# Patient Record
Sex: Female | Born: 1980 | Race: White | Hispanic: No | Marital: Married | Smoking: Never smoker
Health system: Southern US, Community
[De-identification: ages and names within clinical notes are randomized; demographics above are authoritative.]

## PROBLEM LIST (undated history)

## (undated) DIAGNOSIS — O09299 Supervision of pregnancy with other poor reproductive or obstetric history, unspecified trimester: Secondary | ICD-10-CM

## (undated) DIAGNOSIS — R87629 Unspecified abnormal cytological findings in specimens from vagina: Secondary | ICD-10-CM

## (undated) DIAGNOSIS — E119 Type 2 diabetes mellitus without complications: Secondary | ICD-10-CM

## (undated) DIAGNOSIS — Z8759 Personal history of other complications of pregnancy, childbirth and the puerperium: Secondary | ICD-10-CM

## (undated) HISTORY — DX: Type 2 diabetes mellitus without complications: E11.9

## (undated) HISTORY — DX: Unspecified abnormal cytological findings in specimens from vagina: R87.629

## (undated) HISTORY — PX: COLPOSCOPY: SHX161

---

## 2013-10-27 ENCOUNTER — Emergency Department (HOSPITAL_COMMUNITY)
Admission: EM | Admit: 2013-10-27 | Discharge: 2013-10-28 | Disposition: A | Discharge status: Home / Self Care | Payer: 59 | Attending: Emergency Medicine | Admitting: Emergency Medicine

## 2013-10-27 ENCOUNTER — Encounter (HOSPITAL_COMMUNITY): Payer: Self-pay | Admitting: Emergency Medicine

## 2013-10-27 DIAGNOSIS — R1013 Epigastric pain: Secondary | ICD-10-CM | POA: Insufficient documentation

## 2013-10-27 DIAGNOSIS — R509 Fever, unspecified: Secondary | ICD-10-CM | POA: Insufficient documentation

## 2013-10-27 DIAGNOSIS — D709 Neutropenia, unspecified: Secondary | ICD-10-CM | POA: Diagnosis present

## 2013-10-27 DIAGNOSIS — R5081 Fever presenting with conditions classified elsewhere: Secondary | ICD-10-CM

## 2013-10-27 DIAGNOSIS — Z3202 Encounter for pregnancy test, result negative: Secondary | ICD-10-CM | POA: Insufficient documentation

## 2013-10-27 DIAGNOSIS — Z79899 Other long term (current) drug therapy: Secondary | ICD-10-CM | POA: Insufficient documentation

## 2013-10-27 DIAGNOSIS — R110 Nausea: Secondary | ICD-10-CM | POA: Insufficient documentation

## 2013-10-27 DIAGNOSIS — H547 Unspecified visual loss: Secondary | ICD-10-CM | POA: Insufficient documentation

## 2013-10-27 DIAGNOSIS — R1031 Right lower quadrant pain: Secondary | ICD-10-CM | POA: Insufficient documentation

## 2013-10-27 LAB — CBC WITH DIFFERENTIAL/PLATELET
BASOS ABS: 0 10*3/uL (ref 0.0–0.1)
Basophils Relative: 0 % (ref 0–1)
EOS ABS: 0.4 10*3/uL (ref 0.0–0.7)
EOS PCT: 3 % (ref 0–5)
HCT: 43.2 % (ref 36.0–46.0)
Hemoglobin: 14.8 g/dL (ref 12.0–15.0)
LYMPHS PCT: 24 % (ref 12–46)
Lymphs Abs: 3 10*3/uL (ref 0.7–4.0)
MCH: 31.8 pg (ref 26.0–34.0)
MCHC: 34.3 g/dL (ref 30.0–36.0)
MCV: 92.7 fL (ref 78.0–100.0)
Monocytes Absolute: 0.8 10*3/uL (ref 0.1–1.0)
Monocytes Relative: 6 % (ref 3–12)
Neutro Abs: 8.6 10*3/uL — ABNORMAL HIGH (ref 1.7–7.7)
Neutrophils Relative %: 67 % (ref 43–77)
Platelets: 360 10*3/uL (ref 150–400)
RBC: 4.66 MIL/uL (ref 3.87–5.11)
RDW: 12.8 % (ref 11.5–15.5)
WBC: 12.9 10*3/uL — ABNORMAL HIGH (ref 4.0–10.5)

## 2013-10-27 LAB — POC URINE PREG, ED: Preg Test, Ur: NEGATIVE

## 2013-10-27 MED ORDER — ONDANSETRON HCL 4 MG/2ML IJ SOLN: 4.0000 mg | Freq: Once | INTRAMUSCULAR | Status: DC

## 2013-10-27 MED ORDER — ONDANSETRON 4 MG PO TBDP
4.0000 mg | ORAL_TABLET | Freq: Once | ORAL | Status: AC
  Administered 2013-10-27: 4 mg via ORAL
  Filled 2013-10-27: qty 1

## 2013-10-27 NOTE — ED Notes (Signed)
Patient states she is having upper central abd pain x3 days. States pain worsened today. Nausea without vomiting, triggered by pain. Patient denies diarrhea, but does report an increase in frequency of stools.

## 2013-10-28 ENCOUNTER — Inpatient Hospital Stay (HOSPITAL_COMMUNITY): Payer: 59

## 2013-10-28 DIAGNOSIS — R5081 Fever presenting with conditions classified elsewhere: Secondary | ICD-10-CM

## 2013-10-28 DIAGNOSIS — D709 Neutropenia, unspecified: Secondary | ICD-10-CM | POA: Diagnosis present

## 2013-10-28 LAB — COMPREHENSIVE METABOLIC PANEL
ALT: 10 U/L (ref 0–35)
AST: 12 U/L (ref 0–37)
Albumin: 3.7 g/dL (ref 3.5–5.2)
Alkaline Phosphatase: 54 U/L (ref 39–117)
BUN: 10 mg/dL (ref 6–23)
CO2: 22 meq/L (ref 19–32)
Calcium: 9.7 mg/dL (ref 8.4–10.5)
Chloride: 102 mEq/L (ref 96–112)
Creatinine, Ser: 0.75 mg/dL (ref 0.50–1.10)
GFR calc Af Amer: >90 mL/min (ref >90)
GFR calc non Af Amer: >90 mL/min (ref >90)
Glucose, Bld: 168 mg/dL — ABNORMAL HIGH (ref 70–99)
Potassium: 4 mEq/L (ref 3.7–5.3)
Sodium: 142 mEq/L (ref 137–147)
TOTAL PROTEIN: 7.6 g/dL (ref 6.0–8.3)
Total Bilirubin: 0.3 mg/dL (ref 0.3–1.2)

## 2013-10-28 LAB — URINALYSIS, ROUTINE W REFLEX MICROSCOPIC
Bilirubin Urine: NEGATIVE
GLUCOSE, UA: NEGATIVE mg/dL
Ketones, ur: NEGATIVE mg/dL
Nitrite: NEGATIVE
PH: 6 (ref 5.0–8.0)
Protein, ur: NEGATIVE mg/dL
SPECIFIC GRAVITY, URINE: 1.016 (ref 1.005–1.030)
Urobilinogen, UA: 0.2 mg/dL (ref 0.0–1.0)

## 2013-10-28 LAB — LIPASE, BLOOD: Lipase: 22 U/L (ref 11–59)

## 2013-10-28 LAB — URINE MICROSCOPIC-ADD ON

## 2013-10-28 MED ORDER — OXYCODONE-ACETAMINOPHEN 5-325 MG PO TABS
2.0000 | ORAL_TABLET | ORAL | Status: DC | PRN
Start: 2013-10-28 — End: 2017-12-11

## 2013-10-28 MED ORDER — ONDANSETRON 4 MG PO TBDP: ORAL_TABLET | ORAL | Status: DC

## 2013-10-28 MED ORDER — DICYCLOMINE HCL 20 MG PO TABS: 20.0000 mg | ORAL_TABLET | Freq: Two times a day (BID) | ORAL | Status: DC | PRN

## 2013-10-28 NOTE — Discharge Instructions (Signed)
Call and make an appointment to followup with the gastroenterologist. Continue Pepcid as previously prescribed. Return immediately for worsening abdominal pain, fever, persistent vomiting or for any concerns.  Abdominal Pain, Adult Many things can cause abdominal pain. Usually, abdominal pain is not caused by a disease and will improve without treatment. It can often be observed and treated at home. Your health care provider will do a physical exam and possibly order blood tests and X-rays to help determine the seriousness of your pain. However, in many cases, more time must pass before a clear cause of the pain can be found. Before that point, your health care provider may not know if you need more testing or further treatment. HOME CARE INSTRUCTIONS  Monitor your abdominal pain for any changes. The following actions may help to alleviate any discomfort you are experiencing:  Only take over-the-counter or prescription medicines as directed by your health care provider.  Do not take laxatives unless directed to do so by your health care provider.  Try a clear liquid diet (broth, tea, or water) as directed by your health care provider. Slowly move to a bland diet as tolerated. SEEK MEDICAL CARE IF:  You have unexplained abdominal pain.  You have abdominal pain associated with nausea or diarrhea.  You have pain when you urinate or have a bowel movement.  You experience abdominal pain that wakes you in the night.  You have abdominal pain that is worsened or improved by eating food.  You have abdominal pain that is worsened with eating fatty foods. SEEK IMMEDIATE MEDICAL CARE IF:   Your pain does not go away within 2 hours.  You have a fever.  You keep throwing up (vomiting).  Your pain is felt only in portions of the abdomen, such as the right side or the left lower portion of the abdomen.  You pass bloody or black tarry stools. MAKE SURE YOU:  Understand these instructions.    Will watch your condition.   Will get help right away if you are not doing well or get worse.  Document Released: 05/04/2005 Document Revised: 05/15/2013 Document Reviewed: 04/03/2013 [facility] Patient Information 2014 [place], [place].

## 2013-10-28 NOTE — ED Provider Notes (Addendum)
CSN: [id number]     Arrival date & time 10/27/13  2232 History   First MD Initiated Contact with Patient 10/28/13 0019     Chief Complaint  Patient presents with  . Abdominal Pain    x3 days, worsening     (Consider location/radiation/quality/duration/timing/severity/associated sxs/prior Treatment) HPI Patient presents with 3 days of episodic upper abdominal pain. She states is not associated with food. This current episode started roughly 24 hours ago has been constant. She complains of nausea with no vomiting. She's had no diarrhea or constipation. Low grade fever in triage. She denies any urinary symptoms or vaginal bleeding or discharge. She's had no abdominal surgeries. She's recently been seen in urgent care and diagnosed with gastritis. She states the Pepcid that she was prescribed has not improving her symptoms. History reviewed. No pertinent past medical history. History reviewed. No pertinent past surgical history. History reviewed. No pertinent family history. History  Substance Use Topics  . Smoking status: Never Smoker   . Smokeless tobacco: Not on file  . Alcohol Use: Yes     Comment: socially   OB History   Grav Para Term Preterm Abortions TAB SAB Ect Mult Living                 Review of Systems  Constitutional: Positive for fever. Negative for chills.  Respiratory: Negative for cough and shortness of breath.   Cardiovascular: Negative for chest pain.  Gastrointestinal: Positive for nausea and abdominal pain. Negative for vomiting, diarrhea, constipation and blood in stool.  Genitourinary: Negative for dysuria, frequency, flank pain, vaginal bleeding and vaginal discharge.  Musculoskeletal: Negative for back pain, myalgias, neck pain and neck stiffness.  Skin: Negative for rash and wound.  Neurological: Negative for dizziness, weakness, light-headedness, numbness and headaches.      Allergies  Penicillins  Home Medications   Current Outpatient Rx  Name   Route  Sig  Dispense  Refill  . famotidine (PEPCID) 10 MG tablet   Oral   Take 10 mg by mouth 2 (two) times daily as needed for heartburn or indigestion.         Marland Kitchen levonorgestrel-ethinyl estradiol (SEASONALE,INTROVALE,JOLESSA) 0.15-0.03 MG tablet   Oral   Take 1 tablet by mouth every evening.         . dicyclomine (BENTYL) 20 MG tablet   Oral   Take 1 tablet (20 mg total) by mouth 2 (two) times daily as needed for spasms.   20 tablet   0   . ondansetron (ZOFRAN ODT) 4 MG disintegrating tablet      4mg  ODT q4 hours prn nausea/vomit   8 tablet   0   . oxyCODONE-acetaminophen (PERCOCET) 5-325 MG per tablet   Oral   Take 2 tablets by mouth every 4 (four) hours as needed for moderate pain.   20 tablet   0    BP 128/81  Pulse 22  Temp(Src) 100 F (37.8 C) (Oral)  Resp 21  Ht 5\' 9"  (1.753 m)  Wt 196 lb (88.905 kg)  BMI 28.93 kg/m2  SpO2 97%  LMP 08/08/2013 Physical Exam  Nursing note and vitals reviewed. Constitutional: She is oriented to person, place, and time. She appears well-developed and well-nourished. No distress.  HENT:  Head: Normocephalic and atraumatic.  Mouth/Throat: Oropharynx is clear and moist.  Eyes: EOM are normal. Pupils are equal, round, and reactive to light.  Neck: Normal range of motion. Neck supple.  Cardiovascular: Normal rate and regular rhythm.  Pulmonary/Chest: Effort normal and breath sounds normal. No respiratory distress. She has no wheezes. She has no rales. She exhibits no tenderness.  Abdominal: Soft. Bowel sounds are normal. She exhibits no distension and no mass. There is tenderness (epigastric and right lower quadrant tenderness to palpation.). There is no rebound and no guarding.  Musculoskeletal: Normal range of motion. She exhibits no edema and no tenderness.  Neurological: She is alert and oriented to person, place, and time.  Skin: Skin is warm and dry. No rash noted. No erythema.  Psychiatric: She has a normal mood and  affect. Her behavior is normal.    ED Course  Procedures (including critical care time) Labs Review Labs Reviewed  CBC WITH DIFFERENTIAL - Abnormal; Notable for the following:    WBC 12.9 (*)    Neutro Abs 8.6 (*)    All other components within normal limits  COMPREHENSIVE METABOLIC PANEL - Abnormal; Notable for the following:    Glucose, Bld 168 (*)    All other components within normal limits  URINALYSIS, ROUTINE W REFLEX MICROSCOPIC - Abnormal; Notable for the following:    Hgb urine dipstick TRACE (*)    Leukocytes, UA SMALL (*)    All other components within normal limits  URINE MICROSCOPIC-ADD ON - Abnormal; Notable for the following:    Squamous Epithelial / LPF FEW (*)    Bacteria, UA FEW (*)    All other components within normal limits  LIPASE, BLOOD  POC URINE PREG, ED   Imaging Review [place] Abdomen Complete  10/28/2013   CLINICAL DATA:  Upper abdominal pain.  EXAM: ULTRASOUND ABDOMEN COMPLETE  COMPARISON:  No priors.  FINDINGS: Gallbladder:  No gallstones or wall thickening visualized. No sonographic Murphy sign noted.  Common bile duct:  Diameter: 4.6 mm  Liver:  No focal lesion identified. No intrahepatic biliary ductal dilatation. Diffusely echogenic, suggestive of hepatic steatosis.  IVC:  No abnormality visualized.  Pancreas:  Poorly visualized secondary to overlying bowel gas.  Spleen:  Size and appearance within normal limits. Measures up to 9.2 cm in length.  Right Kidney:  Length: 9.5 cm. Echogenicity within normal limits. No mass or hydronephrosis visualized.  Left Kidney:  Length: 8.4 cm. Echogenicity within normal limits. No mass or hydronephrosis visualized.  Abdominal aorta:  No aneurysm visualized.  Other findings:  Small volume of ascites.  IMPRESSION: 1. Small volume of ascites. 2. Echogenic hepatic parenchyma, suggestive of hepatic steatosis. 3. No other acute findings. Specifically, no evidence of gallstones or findings to suggest acute cholecystitis.    Electronically Signed   By: Trudie Reedaniel  Entrikin M.D.   On: 10/28/2013 03:33     EKG Interpretation None      MDM   Final diagnoses:  Epigastric pain   The patient's abdominal pain is completely resolved without any pain medication. Her abdomen is soft and nontender. Her vital signs are stable. She's had a mild elevation in her white blood cell count and mildly elevated temperature on presentation. She describes the pain and spasming and episodic. She's had no vomiting here in the emergency Department and is tolerating fluids well. She does state that she's had multiple loose stools daily. Ultrasound with some small amount of fluid in the hepatorenal recess. Other the patient is stable to be discharged home. I have discussed with the patient and her husband at length the need to followup with the gastroenterologist for further workup. I have strongly advised him to return immediately for any worsening pain, fever, persistent  vomiting or for any concerns.   Loren Racer, MD 10/28/13 1610  Loren Racer, MD 10/28/13 [phone]

## 2017-06-21 LAB — OB RESULTS CONSOLE HIV ANTIBODY (ROUTINE TESTING): HIV: NONREACTIVE

## 2017-06-21 LAB — OB RESULTS CONSOLE RUBELLA ANTIBODY, IGM: RUBELLA: IMMUNE

## 2017-06-21 LAB — OB RESULTS CONSOLE HEPATITIS B SURFACE ANTIGEN: Hepatitis B Surface Ag: NEGATIVE

## 2017-06-21 LAB — OB RESULTS CONSOLE ABO/RH: RH TYPE: POSITIVE

## 2017-06-21 LAB — OB RESULTS CONSOLE GC/CHLAMYDIA
Chlamydia: NEGATIVE
Gonorrhea: NEGATIVE

## 2017-06-21 LAB — OB RESULTS CONSOLE RPR: RPR: NONREACTIVE

## 2017-07-05 ENCOUNTER — Encounter: Payer: Managed Care, Other (non HMO) | Attending: Obstetrics | Admitting: Registered"

## 2017-07-05 DIAGNOSIS — Z713 Dietary counseling and surveillance: Secondary | ICD-10-CM | POA: Insufficient documentation

## 2017-07-05 DIAGNOSIS — O24911 Unspecified diabetes mellitus in pregnancy, first trimester: Secondary | ICD-10-CM | POA: Insufficient documentation

## 2017-07-05 DIAGNOSIS — Z3A00 Weeks of gestation of pregnancy not specified: Secondary | ICD-10-CM | POA: Diagnosis not present

## 2017-07-05 DIAGNOSIS — O24119 Pre-existing diabetes mellitus, type 2, in pregnancy, unspecified trimester: Secondary | ICD-10-CM

## 2017-07-06 ENCOUNTER — Encounter: Payer: Self-pay | Admitting: Registered"

## 2017-07-06 DIAGNOSIS — O24119 Pre-existing diabetes mellitus, type 2, in pregnancy, unspecified trimester: Secondary | ICD-10-CM | POA: Insufficient documentation

## 2017-07-06 NOTE — Progress Notes (Signed)
Patient was seen on 07/05/2017 for Gestational Diabetes self-management class at the [facility]. The following learning objectives were met by the patient during this course:   States the definition of Gestational Diabetes  States why dietary management is important in controlling blood glucose  Describes the effects each nutrient has on blood glucose levels  Demonstrates ability to create a balanced meal plan  Demonstrates carbohydrate counting   States when to check blood glucose levels  Demonstrates proper blood glucose monitoring techniques  States the effect of stress and exercise on blood glucose levels  States the importance of limiting caffeine and abstaining from alcohol and smoking  Blood glucose monitor given: none Lot # n/a Exp: n/a Blood glucose reading: n/a  Patient instructed to monitor glucose levels: FBS: 60 - <95 1 hour: <140 2 hour: <120  Patient received handouts:  Nutrition Diabetes and Pregnancy  Carbohydrate Counting List  Patient will be seen for follow-up as needed. 

## 2017-12-08 ENCOUNTER — Encounter (HOSPITAL_COMMUNITY): Payer: Self-pay | Admitting: *Deleted

## 2017-12-08 ENCOUNTER — Telehealth (HOSPITAL_COMMUNITY): Payer: Self-pay | Admitting: *Deleted

## 2017-12-08 NOTE — Telephone Encounter (Signed)
Preadmission screen  

## 2017-12-11 ENCOUNTER — Encounter (HOSPITAL_COMMUNITY): Payer: Self-pay | Admitting: *Deleted

## 2017-12-11 ENCOUNTER — Inpatient Hospital Stay (HOSPITAL_COMMUNITY)
Admission: AD | Admit: 2017-12-11 | Discharge: 2017-12-15 | DRG: 788 | Disposition: A | Discharge status: Home / Self Care | Payer: Managed Care, Other (non HMO) | Source: Ambulatory Visit | Attending: Obstetrics | Admitting: Obstetrics

## 2017-12-11 ENCOUNTER — Telehealth (HOSPITAL_COMMUNITY): Payer: Self-pay | Admitting: *Deleted

## 2017-12-11 DIAGNOSIS — O1410 Severe pre-eclampsia, unspecified trimester: Secondary | ICD-10-CM | POA: Diagnosis present

## 2017-12-11 DIAGNOSIS — O9962 Diseases of the digestive system complicating childbirth: Secondary | ICD-10-CM | POA: Diagnosis present

## 2017-12-11 DIAGNOSIS — Z880 Allergy status to penicillin: Secondary | ICD-10-CM | POA: Diagnosis not present

## 2017-12-11 DIAGNOSIS — O2412 Pre-existing diabetes mellitus, type 2, in childbirth: Secondary | ICD-10-CM | POA: Diagnosis present

## 2017-12-11 DIAGNOSIS — R5081 Fever presenting with conditions classified elsewhere: Secondary | ICD-10-CM

## 2017-12-11 DIAGNOSIS — O99824 Streptococcus B carrier state complicating childbirth: Secondary | ICD-10-CM | POA: Diagnosis present

## 2017-12-11 DIAGNOSIS — Z3A35 35 weeks gestation of pregnancy: Secondary | ICD-10-CM | POA: Diagnosis not present

## 2017-12-11 DIAGNOSIS — Z141 Cystic fibrosis carrier: Secondary | ICD-10-CM

## 2017-12-11 DIAGNOSIS — Z794 Long term (current) use of insulin: Secondary | ICD-10-CM | POA: Diagnosis not present

## 2017-12-11 DIAGNOSIS — D709 Neutropenia, unspecified: Secondary | ICD-10-CM

## 2017-12-11 DIAGNOSIS — O1413 Severe pre-eclampsia, third trimester: Secondary | ICD-10-CM

## 2017-12-11 DIAGNOSIS — K219 Gastro-esophageal reflux disease without esophagitis: Secondary | ICD-10-CM | POA: Diagnosis present

## 2017-12-11 DIAGNOSIS — E119 Type 2 diabetes mellitus without complications: Secondary | ICD-10-CM | POA: Diagnosis present

## 2017-12-11 DIAGNOSIS — O24119 Pre-existing diabetes mellitus, type 2, in pregnancy, unspecified trimester: Secondary | ICD-10-CM

## 2017-12-11 DIAGNOSIS — O1424 HELLP syndrome, complicating childbirth: Secondary | ICD-10-CM | POA: Diagnosis present

## 2017-12-11 DIAGNOSIS — O134 Gestational [pregnancy-induced] hypertension without significant proteinuria, complicating childbirth: Secondary | ICD-10-CM | POA: Diagnosis present

## 2017-12-11 LAB — COMPREHENSIVE METABOLIC PANEL
ALBUMIN: 3.3 g/dL — AB (ref 3.5–5.0)
ALK PHOS: 74 U/L (ref 38–126)
ALT: 60 U/L — AB (ref 14–54)
ANION GAP: 13 (ref 5–15)
AST: 54 U/L — AB (ref 15–41)
BILIRUBIN TOTAL: 0.4 mg/dL (ref 0.3–1.2)
BUN: 13 mg/dL (ref 6–20)
CALCIUM: 9.3 mg/dL (ref 8.9–10.3)
CO2: 19 mmol/L — AB (ref 22–32)
Chloride: 103 mmol/L (ref 101–111)
Creatinine, Ser: 0.66 mg/dL (ref 0.44–1.00)
GFR calc Af Amer: >60 mL/min (ref >60)
GFR calc non Af Amer: >60 mL/min (ref >60)
GLUCOSE: 107 mg/dL — AB (ref 65–99)
Potassium: 3.8 mmol/L (ref 3.5–5.1)
SODIUM: 135 mmol/L (ref 135–145)
Total Protein: 7.3 g/dL (ref 6.5–8.1)

## 2017-12-11 LAB — TYPE AND SCREEN
ABO/RH(D): A POS
ABO/RH(D): A POS
Antibody Screen: NEGATIVE
Antibody Screen: NEGATIVE

## 2017-12-11 LAB — URIC ACID: Uric Acid, Serum: 8.5 mg/dL — ABNORMAL HIGH (ref 2.3–6.6)

## 2017-12-11 LAB — OB RESULTS CONSOLE GBS: GBS: POSITIVE

## 2017-12-11 LAB — CBC
HEMATOCRIT: 36.1 % (ref 36.0–46.0)
Hemoglobin: 12.5 g/dL (ref 12.0–15.0)
MCH: 32.1 pg (ref 26.0–34.0)
MCHC: 34.6 g/dL (ref 30.0–36.0)
MCV: 92.8 fL (ref 78.0–100.0)
Platelets: 167 10*3/uL (ref 150–400)
RBC: 3.89 MIL/uL (ref 3.87–5.11)
RDW: 14.3 % (ref 11.5–15.5)
WBC: 14.4 10*3/uL — ABNORMAL HIGH (ref 4.0–10.5)

## 2017-12-11 LAB — PROTEIN / CREATININE RATIO, URINE
Creatinine, Urine: 48 mg/dL
Protein Creatinine Ratio: 0.54 mg/mg{Cre} — ABNORMAL HIGH (ref 0.00–0.15)
Total Protein, Urine: 26 mg/dL

## 2017-12-11 LAB — GROUP B STREP BY PCR: Group B strep by PCR: POSITIVE — AB

## 2017-12-11 MED ORDER — INSULIN GLARGINE 100 UNIT/ML ~~LOC~~ SOLN
18.0000 [IU] | Freq: Every day | SUBCUTANEOUS | Status: AC
  Administered 2017-12-12: 18 [IU] via SUBCUTANEOUS
  Filled 2017-12-11: qty 0.18

## 2017-12-11 MED ORDER — MISOPROSTOL 25 MCG QUARTER TABLET
25.0000 ug | ORAL_TABLET | ORAL | Status: AC
  Administered 2017-12-11 – 2017-12-12 (×2): 25 ug via VAGINAL
  Filled 2017-12-11 (×2): qty 1

## 2017-12-11 MED ORDER — LACTATED RINGERS IV SOLN
INTRAVENOUS | Status: DC
  Administered 2017-12-11 – 2017-12-12 (×3): via INTRAVENOUS

## 2017-12-11 MED ORDER — OXYTOCIN BOLUS FROM INFUSION: 500.0000 mL | Freq: Once | INTRAVENOUS | Status: DC

## 2017-12-11 MED ORDER — OXYCODONE-ACETAMINOPHEN 5-325 MG PO TABS: 2.0000 | ORAL_TABLET | ORAL | Status: DC | PRN

## 2017-12-11 MED ORDER — LABETALOL HCL 5 MG/ML IV SOLN
20.0000 mg | INTRAVENOUS | Status: DC | PRN
  Administered 2017-12-11: 20 mg via INTRAVENOUS
  Administered 2017-12-11: 40 mg via INTRAVENOUS
  Filled 2017-12-11: qty 8
  Filled 2017-12-11: qty 4

## 2017-12-11 MED ORDER — SODIUM CHLORIDE 0.9 % IV SOLN: INTRAVENOUS | Status: DC

## 2017-12-11 MED ORDER — HYDRALAZINE HCL 20 MG/ML IJ SOLN: 10.0000 mg | Freq: Once | INTRAMUSCULAR | Status: DC | PRN

## 2017-12-11 MED ORDER — OXYCODONE-ACETAMINOPHEN 5-325 MG PO TABS: 1.0000 | ORAL_TABLET | ORAL | Status: DC | PRN

## 2017-12-11 MED ORDER — OXYTOCIN 40 UNITS IN LACTATED RINGERS INFUSION - SIMPLE MED: 1.0000 m[IU]/min | INTRAVENOUS | Status: DC

## 2017-12-11 MED ORDER — ZOLPIDEM TARTRATE 5 MG PO TABS
5.0000 mg | ORAL_TABLET | Freq: Every evening | ORAL | Status: DC | PRN
  Administered 2017-12-12: 5 mg via ORAL
  Filled 2017-12-11: qty 1

## 2017-12-11 MED ORDER — ACETAMINOPHEN 325 MG PO TABS: 650.0000 mg | ORAL_TABLET | ORAL | Status: DC | PRN

## 2017-12-11 MED ORDER — TERBUTALINE SULFATE 1 MG/ML IJ SOLN: 0.2500 mg | Freq: Once | INTRAMUSCULAR | Status: DC | PRN

## 2017-12-11 MED ORDER — LIDOCAINE HCL (PF) 1 % IJ SOLN: 30.0000 mL | INTRAMUSCULAR | Status: DC | PRN

## 2017-12-11 MED ORDER — OXYTOCIN 40 UNITS IN LACTATED RINGERS INFUSION - SIMPLE MED: 2.5000 [IU]/h | INTRAVENOUS | Status: DC

## 2017-12-11 MED ORDER — ONDANSETRON HCL 4 MG/2ML IJ SOLN
4.0000 mg | Freq: Four times a day (QID) | INTRAMUSCULAR | Status: DC | PRN
  Administered 2017-12-12: 4 mg via INTRAVENOUS
  Filled 2017-12-11: qty 2

## 2017-12-11 MED ORDER — LACTATED RINGERS IV SOLN: 500.0000 mL | INTRAVENOUS | Status: DC | PRN

## 2017-12-11 MED ORDER — SOD CITRATE-CITRIC ACID 500-334 MG/5ML PO SOLN
30.0000 mL | ORAL | Status: DC | PRN
  Administered 2017-12-11 – 2017-12-12 (×2): 30 mL via ORAL
  Filled 2017-12-11 (×2): qty 15

## 2017-12-11 MED ORDER — MAGNESIUM SULFATE 40 G IN LACTATED RINGERS - SIMPLE
2.0000 g/h | INTRAVENOUS | Status: DC
  Administered 2017-12-11 – 2017-12-12 (×2): 2 g/h via INTRAVENOUS
  Filled 2017-12-11 (×2): qty 40

## 2017-12-11 MED ORDER — MAGNESIUM SULFATE BOLUS VIA INFUSION
4.0000 g | Freq: Once | INTRAVENOUS | Status: AC
  Administered 2017-12-11: 4 g via INTRAVENOUS
  Filled 2017-12-11: qty 500

## 2017-12-11 NOTE — H&P (Signed)
Brianna Taylor is a 37 y.o. G1P0 at [redacted]w[redacted]d presenting for evaluation of elevated LFTs. Pt notes no contractions . Good fetal movement, No vaginal bleeding,  not leaking fluid. Patient with type 2 diabetes and recent diagnosis of gestational hypertension. Patient was seen in the office today for a work in visit after reporting bilateral upper abdominal pain. She had more epigastric pain then right upper quadrant pain. Her exam was not concerning for preeclampsia but labs were sent. This afternoon her AST and ALT returned in the 40s and 50s representing a marked elevation from labs at the end of last week. Her platelets were also noted to be slightly decreased.Patent patient was called in for repeat evaluation.  Patient notes no headache, no scotomata,resolution of her epigastric pain after taking Zantac. Patient reports no leakage of fluid, no vaginal bleeding, no contractions, active fetal movement.   [facility] - dated by LMP consistent with a 5 weekultrasound - History of infertility, Femara conception - Type 2 diabetes. Starting hemoglobin A1c was 6.3. Patient was on metformin through the first trimester and switch to Lantus at 11 weeks. Long-acting insulin was slowly titrated up through the pregnancy and eventually she was switched to [employer]. She has had reactive third trimester testing. - Patient carries cystic fibrosis and Sheral Apley. Her partner was negative for carrier testing - GERD - Gestational hypertension developed at 35 weeks. Patient was given betamethasone and had normal 24-hour urine with normal labs. AST and ALT were 9 and 12 respectively. Her uric acid was the only lab abnormality at 7.9. Patient's blood pressures were 140s to 150s over 90s. She was started on labetalol today - Fetal growth.29 weeks 97 percentile. Repeat ultrasound at 34 weeks. Growth 5 lbs. 12 oz., 70th percentile - PCN allergy. GBS positive    Prenatal Transfer Tool  Maternal  Diabetes: Yes:  Diabetes Type:  Pre-pregnancy Genetic Screening: Normal Maternal Ultrasounds/Referrals: Normal Fetal Ultrasounds or other Referrals: normal fetal echo Maternal Substance Abuse:  No Significant Maternal Medications:  Meds include: Other:  Lantus Significant Maternal Lab Results: Lab values include: Group B Strep positive, Other:      OB History    Gravida  1   Para      Term      Preterm      AB      Living        SAB      TAB      Ectopic      Multiple      Live Births             Past Medical History:  Diagnosis Date  . Diabetes mellitus without complication (HCC)    type 2 on insulin  . Vaginal Pap smear, abnormal    Past Surgical History:  Procedure Laterality Date  . COLPOSCOPY     Family History: family history is not on file. Social History:  reports that she has never smoked. She does not have any smokeless tobacco history on file. She reports that she drinks alcohol. Her drug history is not on file.  Review of Systems - Negative except discomfort of pregnancy, anxiety due to current situation     Blood pressure (!) 154/82, pulse 79, temperature 98.4 F (36.9 C), temperature source Oral, resp. rate 16, height  (1.753 m), weight 116.6 kg (257 lb).  Physical Exam:   Vitals:   12/11/17 1931 12/11/17 2001 12/11/17 2016 12/11/17 2043  BP: Marland Kitchen)  151/89 (!) 144/83 (!) 154/82   Pulse: 88 85 79   Resp:      Temp:    98.4 F (36.9 C)  TempSrc:    Oral  Weight:      Height:        Gen: well appearing, no distress CV: RRR Pulm: CTAB Back: no CVAT Abd: gravid, NT, no RUQ pain LE: trace edema, equal bilaterally, non-tender, 2+ DTR, 1 beat clonus Toco: rare FH: baseline 140s, accelerations present, no deceleratons, 10 beat variability  Prenatal labs: ABO, Rh: A/Positive/-- (11/14 0000) Antibody:   neg Rubella: Immune (11/14 0000) RPR: Nonreactive (11/14 0000)  HBsAg: Negative (11/14 0000)  HIV: Non-reactive (11/14  0000)  GBS:   pos 1 hr Glucola T2DM  Genetic screening nl NT, nl AFP Anatomy US normal  CBC    Component Value Date/Time   WBC 14.4 (H) 12/11/2017 1850   RBC 3.89 12/11/2017 1850   HGB 12.5 12/11/2017 1850   HCT 36.1 12/11/2017 1850   PLT 167 12/11/2017 1850   MCV 92.8 12/11/2017 1850   MCH 32.1 12/11/2017 1850   MCHC 34.6 12/11/2017 1850   RDW 14.3 12/11/2017 1850   LYMPHSABS 3.0 10/27/2013 2340   MONOABS 0.8 10/27/2013 2340   EOSABS 0.4 10/27/2013 2340   BASOSABS 0.0 10/27/2013 2340    CMP     Component Value Date/Time   NA 135 12/11/2017 1850   K 3.8 12/11/2017 1850   CL 103 12/11/2017 1850   CO2 19 (L) 12/11/2017 1850   GLUCOSE 107 (H) 12/11/2017 1850   BUN 13 12/11/2017 1850   CREATININE 0.66 12/11/2017 1850   CALCIUM 9.3 12/11/2017 1850   PROT 7.3 12/11/2017 1850   ALBUMIN 3.3 (L) 12/11/2017 1850   AST 54 (H) 12/11/2017 1850   ALT 60 (H) 12/11/2017 1850   ALKPHOS 74 12/11/2017 1850   BILITOT 0.4 12/11/2017 1850   GFRNONAA >60 12/11/2017 1850   GFRAA >60 12/11/2017 1850      Assessment/Plan: 37 y.o. G1P0 at [redacted]w[redacted]d Preeclampsia with severe features. Patient has had a diastolic in the 160s here in MAU and prior in our office. More significant patient has had a tripling of her baseline low AST and ALT. This has also corresponded with a drop in her platelets. She does not currently have thrombocytopenia but she is trending into that position. Concern for developing hellp syndrome. Given that she is steroid complete, almost 36 weeks and with her risk factors of gestational hypertension and gestational diabetes we will admit for cervical ripening and delivery. Plan Cytotec overnight with evaluation for Pitocin and possible Foley bulb in the a.m. Will continue her on her Lantus but drop her dose tonight knowing that she will the only on diabetic clears tomorrow. Will plan magnesium given the severe preeclampsia. May repeat labs with increased blood pressures. Will plan  labetalol protocol as needed for severe range blood pressures if they return. GBS has returned positive. We will decidet what patient's penicillin allergy is and decide on an appropriate antibiotic   - prematurity risks reviewed with patient.   Lendon Colonel 12/11/2017, 8:55 PM

## 2017-12-11 NOTE — MAU Note (Signed)
Pt reports she has been followed for elevated pressures. Had lab work yesterday and was told her liver enzymes where elevated and she needed to come in for monitoring and more lab work. Denies any H/A, visual changes . reports some pain in her  Alfredo Batty that is not as bad as it was yesterday.Good fetal movemnt reported

## 2017-12-11 NOTE — Anesthesia Pain Management Evaluation Note (Signed)
  CRNA Pain Management Visit Note  Patient: Brianna Taylor, 37 y.o., female  "Hello I am a member of the anesthesia team at [facility]. We have an anesthesia team available at all times to provide care throughout the hospital, including epidural management and anesthesia for C-section. I don't know your plan for the delivery whether it a natural birth, water birth, IV sedation, nitrous supplementation, doula or epidural, but we want to meet your pain goals."   1.Was your pain managed to your expectations on prior hospitalizations?   No prior hospitalizations  2.What is your expectation for pain management during this hospitalization?     Epidural  3.How can we help you reach that goal? Epidural at pain goal.  Record the patient's initial score and the patient's pain goal.   Pain: 2  Pain Goal: 5 [facility] wants you to be able to say your pain was always managed very well.  Merrick Feutz 12/11/2017

## 2017-12-11 NOTE — Telephone Encounter (Signed)
Preadmission screen  

## 2017-12-12 ENCOUNTER — Inpatient Hospital Stay (HOSPITAL_COMMUNITY): Payer: Managed Care, Other (non HMO) | Admitting: Certified Registered Nurse Anesthetist

## 2017-12-12 ENCOUNTER — Other Ambulatory Visit: Payer: Self-pay

## 2017-12-12 ENCOUNTER — Encounter (HOSPITAL_COMMUNITY): Payer: Self-pay

## 2017-12-12 ENCOUNTER — Encounter (HOSPITAL_COMMUNITY)
Admission: AD | Disposition: A | Discharge status: Home / Self Care | Payer: Self-pay | Source: Ambulatory Visit | Attending: Obstetrics

## 2017-12-12 LAB — CBC
HCT: 35.5 % — ABNORMAL LOW (ref 36.0–46.0)
HEMATOCRIT: 37.9 % (ref 36.0–46.0)
HEMOGLOBIN: 12.9 g/dL (ref 12.0–15.0)
Hemoglobin: 12.5 g/dL (ref 12.0–15.0)
MCH: 32.4 pg (ref 26.0–34.0)
MCH: 32 pg (ref 26.0–34.0)
MCHC: 35.2 g/dL (ref 30.0–36.0)
MCHC: 34 g/dL (ref 30.0–36.0)
MCV: 92 fL (ref 78.0–100.0)
MCV: 94 fL (ref 78.0–100.0)
PLATELETS: 103 10*3/uL — AB (ref 150–400)
Platelets: 104 10*3/uL — ABNORMAL LOW (ref 150–400)
RBC: 3.86 MIL/uL — AB (ref 3.87–5.11)
RBC: 4.03 MIL/uL (ref 3.87–5.11)
RDW: 14.4 % (ref 11.5–15.5)
RDW: 14.8 % (ref 11.5–15.5)
WBC: 14.9 10*3/uL — AB (ref 4.0–10.5)
WBC: 15.6 10*3/uL — AB (ref 4.0–10.5)

## 2017-12-12 LAB — COMPREHENSIVE METABOLIC PANEL
ALT: 164 U/L — AB (ref 14–54)
ALT: 235 U/L — ABNORMAL HIGH (ref 14–54)
ANION GAP: 13 (ref 5–15)
ANION GAP: 11 (ref 5–15)
AST: 206 U/L — ABNORMAL HIGH (ref 15–41)
AST: <5 U/L — ABNORMAL LOW (ref 15–41)
Albumin: 3 g/dL — ABNORMAL LOW (ref 3.5–5.0)
Albumin: 2.7 g/dL — ABNORMAL LOW (ref 3.5–5.0)
Alkaline Phosphatase: 73 U/L (ref 38–126)
Alkaline Phosphatase: 72 U/L (ref 38–126)
BUN: 12 mg/dL (ref 6–20)
BUN: 11 mg/dL (ref 6–20)
CALCIUM: 7.6 mg/dL — AB (ref 8.9–10.3)
CHLORIDE: 101 mmol/L (ref 101–111)
CHLORIDE: 95 mmol/L — AB (ref 101–111)
CO2: 19 mmol/L — ABNORMAL LOW (ref 22–32)
CO2: 24 mmol/L (ref 22–32)
Calcium: 7.8 mg/dL — ABNORMAL LOW (ref 8.9–10.3)
Creatinine, Ser: 0.67 mg/dL (ref 0.44–1.00)
Creatinine, Ser: 0.77 mg/dL (ref 0.44–1.00)
Glucose, Bld: 136 mg/dL — ABNORMAL HIGH (ref 65–99)
Glucose, Bld: 143 mg/dL — ABNORMAL HIGH (ref 65–99)
POTASSIUM: 4.2 mmol/L (ref 3.5–5.1)
Potassium: 4.4 mmol/L (ref 3.5–5.1)
SODIUM: 130 mmol/L — AB (ref 135–145)
Sodium: 133 mmol/L — ABNORMAL LOW (ref 135–145)
Total Bilirubin: 0.8 mg/dL (ref 0.3–1.2)
Total Bilirubin: 1 mg/dL (ref 0.3–1.2)
Total Protein: 6.3 g/dL — ABNORMAL LOW (ref 6.5–8.1)
Total Protein: 6.3 g/dL — ABNORMAL LOW (ref 6.5–8.1)

## 2017-12-12 LAB — GLUCOSE, CAPILLARY
GLUCOSE-CAPILLARY: 143 mg/dL — AB (ref 65–99)
GLUCOSE-CAPILLARY: 149 mg/dL — AB (ref 65–99)
GLUCOSE-CAPILLARY: 135 mg/dL — AB (ref 65–99)
Glucose-Capillary: 151 mg/dL — ABNORMAL HIGH (ref 65–99)
Glucose-Capillary: 135 mg/dL — ABNORMAL HIGH (ref 65–99)
Glucose-Capillary: 155 mg/dL — ABNORMAL HIGH (ref 65–99)

## 2017-12-12 LAB — CBC WITH DIFFERENTIAL/PLATELET
BASOS ABS: 0 10*3/uL (ref 0.0–0.1)
Basophils Relative: 0 %
EOS ABS: 0 10*3/uL (ref 0.0–0.7)
Eosinophils Relative: 0 %
HCT: 33.5 % — ABNORMAL LOW (ref 36.0–46.0)
Hemoglobin: 11.7 g/dL — ABNORMAL LOW (ref 12.0–15.0)
Lymphocytes Relative: 16 %
Lymphs Abs: 2 10*3/uL (ref 0.7–4.0)
MCH: 32.3 pg (ref 26.0–34.0)
MCHC: 34.9 g/dL (ref 30.0–36.0)
MCV: 92.5 fL (ref 78.0–100.0)
MONO ABS: 0.4 10*3/uL (ref 0.1–1.0)
Monocytes Relative: 3 %
NEUTROS ABS: 9.9 10*3/uL — AB (ref 1.7–7.7)
Neutrophils Relative %: 81 %
PLATELETS: 52 10*3/uL — AB (ref 150–400)
RBC: 3.62 MIL/uL — ABNORMAL LOW (ref 3.87–5.11)
RDW: 14.7 % (ref 11.5–15.5)
WBC Morphology: INCREASED
WBC: 12.3 10*3/uL — AB (ref 4.0–10.5)

## 2017-12-12 LAB — ALKALINE PHOSPHATASE: Alkaline Phosphatase: 67 U/L (ref 38–126)

## 2017-12-12 LAB — AST: AST: 286 U/L — ABNORMAL HIGH (ref 15–41)

## 2017-12-12 LAB — ALT: ALT: 237 U/L — ABNORMAL HIGH (ref 14–54)

## 2017-12-12 LAB — RPR: RPR Ser Ql: NONREACTIVE

## 2017-12-12 SURGERY — Surgical Case
Anesthesia: Spinal

## 2017-12-12 MED ORDER — PHENYLEPHRINE 8 MG IN D5W 100 ML (0.08MG/ML) PREMIX OPTIME
INJECTION | INTRAVENOUS | Status: DC | PRN
  Administered 2017-12-12: 40 ug/min via INTRAVENOUS

## 2017-12-12 MED ORDER — INSULIN GLARGINE 100 UNIT/ML ~~LOC~~ SOLN
16.0000 [IU] | Freq: Every day | SUBCUTANEOUS | Status: DC
  Administered 2017-12-12 – 2017-12-14 (×3): 16 [IU] via SUBCUTANEOUS
  Filled 2017-12-12 (×4): qty 0.16

## 2017-12-12 MED ORDER — MENTHOL 3 MG MT LOZG: 1.0000 | LOZENGE | OROMUCOSAL | Status: DC | PRN

## 2017-12-12 MED ORDER — PROMETHAZINE HCL 25 MG/ML IJ SOLN: 6.2500 mg | INTRAMUSCULAR | Status: DC | PRN

## 2017-12-12 MED ORDER — SCOPOLAMINE 1 MG/3DAYS TD PT72
MEDICATED_PATCH | TRANSDERMAL | Status: AC
  Administered 2017-12-12: 1.5 mg
  Filled 2017-12-12: qty 1

## 2017-12-12 MED ORDER — SIMETHICONE 80 MG PO CHEW
80.0000 mg | CHEWABLE_TABLET | ORAL | Status: DC
  Administered 2017-12-12 – 2017-12-14 (×3): 80 mg via ORAL
  Filled 2017-12-12 (×3): qty 1

## 2017-12-12 MED ORDER — MIDAZOLAM HCL 2 MG/2ML IJ SOLN: 0.5000 mg | Freq: Once | INTRAMUSCULAR | Status: DC | PRN

## 2017-12-12 MED ORDER — MORPHINE SULFATE (PF) 4 MG/ML IV SOLN: 1.0000 mg | INTRAVENOUS | Status: DC | PRN

## 2017-12-12 MED ORDER — SIMETHICONE 80 MG PO CHEW
80.0000 mg | CHEWABLE_TABLET | Freq: Three times a day (TID) | ORAL | Status: DC
  Administered 2017-12-12 – 2017-12-15 (×9): 80 mg via ORAL
  Filled 2017-12-12 (×9): qty 1

## 2017-12-12 MED ORDER — COCONUT OIL OIL: 1.0000 "application " | TOPICAL_OIL | Status: DC | PRN

## 2017-12-12 MED ORDER — OXYTOCIN 10 UNIT/ML IJ SOLN
INTRAMUSCULAR | Status: AC
  Filled 2017-12-12: qty 4

## 2017-12-12 MED ORDER — FENTANYL CITRATE (PF) 100 MCG/2ML IJ SOLN
INTRAMUSCULAR | Status: DC | PRN
  Administered 2017-12-12: 10 ug via INTRATHECAL

## 2017-12-12 MED ORDER — NALBUPHINE HCL 10 MG/ML IJ SOLN
5.0000 mg | INTRAMUSCULAR | Status: DC | PRN
  Administered 2017-12-12: 5 mg via INTRAVENOUS
  Filled 2017-12-12: qty 1

## 2017-12-12 MED ORDER — ONDANSETRON HCL 4 MG/2ML IJ SOLN
INTRAMUSCULAR | Status: AC
  Filled 2017-12-12: qty 2

## 2017-12-12 MED ORDER — FAMOTIDINE IN NACL 20-0.9 MG/50ML-% IV SOLN
20.0000 mg | INTRAVENOUS | Status: DC
  Administered 2017-12-12: 20 mg via INTRAVENOUS
  Filled 2017-12-12: qty 50

## 2017-12-12 MED ORDER — ZOLPIDEM TARTRATE 5 MG PO TABS: 5.0000 mg | ORAL_TABLET | Freq: Every evening | ORAL | Status: DC | PRN

## 2017-12-12 MED ORDER — WITCH HAZEL-GLYCERIN EX PADS: 1.0000 "application " | MEDICATED_PAD | CUTANEOUS | Status: DC | PRN

## 2017-12-12 MED ORDER — BUPIVACAINE IN DEXTROSE 0.75-8.25 % IT SOLN
INTRATHECAL | Status: DC | PRN
  Administered 2017-12-12: 1.6 mL via INTRATHECAL

## 2017-12-12 MED ORDER — LACTATED RINGERS IV SOLN
INTRAVENOUS | Status: DC
  Administered 2017-12-13: 100 mL/h via INTRAVENOUS

## 2017-12-12 MED ORDER — CEFAZOLIN SODIUM-DEXTROSE 2-3 GM-%(50ML) IV SOLR
INTRAVENOUS | Status: DC | PRN
  Administered 2017-12-12: 2 g via INTRAVENOUS

## 2017-12-12 MED ORDER — SIMETHICONE 80 MG PO CHEW: 80.0000 mg | CHEWABLE_TABLET | ORAL | Status: DC | PRN

## 2017-12-12 MED ORDER — LACTATED RINGERS IV SOLN
INTRAVENOUS | Status: DC | PRN
  Administered 2017-12-12: 10:00:00 via INTRAVENOUS

## 2017-12-12 MED ORDER — PRENATAL MULTIVITAMIN CH
1.0000 | ORAL_TABLET | Freq: Every day | ORAL | Status: DC
  Administered 2017-12-13 – 2017-12-15 (×3): 1 via ORAL
  Filled 2017-12-12 (×3): qty 1

## 2017-12-12 MED ORDER — TETANUS-DIPHTH-ACELL PERTUSSIS 5-2.5-18.5 LF-MCG/0.5 IM SUSP: 0.5000 mL | Freq: Once | INTRAMUSCULAR | Status: DC

## 2017-12-12 MED ORDER — SENNOSIDES-DOCUSATE SODIUM 8.6-50 MG PO TABS
2.0000 | ORAL_TABLET | ORAL | Status: DC
  Administered 2017-12-12 – 2017-12-13 (×2): 2 via ORAL
  Filled 2017-12-12 (×2): qty 2

## 2017-12-12 MED ORDER — PHENYLEPHRINE 8 MG IN D5W 100 ML (0.08MG/ML) PREMIX OPTIME
INJECTION | INTRAVENOUS | Status: AC
  Filled 2017-12-12: qty 100

## 2017-12-12 MED ORDER — ACETAMINOPHEN 325 MG PO TABS
650.0000 mg | ORAL_TABLET | ORAL | Status: DC | PRN
  Administered 2017-12-12 – 2017-12-13 (×2): 650 mg via ORAL
  Filled 2017-12-12 (×2): qty 2

## 2017-12-12 MED ORDER — OXYTOCIN 10 UNIT/ML IJ SOLN
INTRAMUSCULAR | Status: DC | PRN
  Administered 2017-12-12: 40 [IU] via INTRAVENOUS

## 2017-12-12 MED ORDER — MORPHINE SULFATE (PF) 0.5 MG/ML IJ SOLN
INTRAMUSCULAR | Status: DC | PRN
  Administered 2017-12-12: .2 mg via INTRATHECAL

## 2017-12-12 MED ORDER — MEPERIDINE HCL 25 MG/ML IJ SOLN: 6.2500 mg | INTRAMUSCULAR | Status: DC | PRN

## 2017-12-12 MED ORDER — DIBUCAINE 1 % RE OINT: 1.0000 "application " | TOPICAL_OINTMENT | RECTAL | Status: DC | PRN

## 2017-12-12 MED ORDER — FAMOTIDINE 20 MG PO TABS
20.0000 mg | ORAL_TABLET | Freq: Two times a day (BID) | ORAL | Status: DC
  Administered 2017-12-12 – 2017-12-15 (×6): 20 mg via ORAL
  Filled 2017-12-12 (×6): qty 1

## 2017-12-12 MED ORDER — MORPHINE SULFATE (PF) 0.5 MG/ML IJ SOLN
INTRAMUSCULAR | Status: AC
  Filled 2017-12-12: qty 10

## 2017-12-12 MED ORDER — DIPHENHYDRAMINE HCL 25 MG PO CAPS: 25.0000 mg | ORAL_CAPSULE | Freq: Four times a day (QID) | ORAL | Status: DC | PRN

## 2017-12-12 MED ORDER — SODIUM CHLORIDE 0.9 % IR SOLN
Status: DC | PRN
  Administered 2017-12-12: 1

## 2017-12-12 MED ORDER — ONDANSETRON HCL 4 MG/2ML IJ SOLN
INTRAMUSCULAR | Status: DC | PRN
  Administered 2017-12-12: 4 mg via INTRAVENOUS

## 2017-12-12 MED ORDER — FENTANYL CITRATE (PF) 100 MCG/2ML IJ SOLN
INTRAMUSCULAR | Status: AC
  Filled 2017-12-12: qty 2

## 2017-12-12 MED ORDER — OXYTOCIN 40 UNITS IN LACTATED RINGERS INFUSION - SIMPLE MED: 2.5000 [IU]/h | INTRAVENOUS | Status: DC

## 2017-12-12 SURGICAL SUPPLY — 38 items
BENZOIN TINCTURE PRP APPL 2/3 (GAUZE/BANDAGES/DRESSINGS) ×3 IMPLANT
CHLORAPREP W/TINT 26ML (MISCELLANEOUS) ×3 IMPLANT
CLAMP CORD UMBIL (MISCELLANEOUS) IMPLANT
CLOSURE WOUND 1/2 X4 (GAUZE/BANDAGES/DRESSINGS) ×1
CLOTH BEACON ORANGE TIMEOUT ST (SAFETY) ×3 IMPLANT
DRSG OPSITE POSTOP 4X10 (GAUZE/BANDAGES/DRESSINGS) ×3 IMPLANT
ELECT REM PT RETURN 9FT ADLT (ELECTROSURGICAL) ×3
ELECTRODE REM PT RTRN 9FT ADLT (ELECTROSURGICAL) ×1 IMPLANT
EXTRACTOR VACUUM M CUP 4 TUBE (SUCTIONS) IMPLANT
EXTRACTOR VACUUM M CUP 4' TUBE (SUCTIONS)
GLOVE BIO SURGEON STRL SZ 6.5 (GLOVE) ×2 IMPLANT
GLOVE BIO SURGEONS STRL SZ 6.5 (GLOVE) ×1
GLOVE BIOGEL PI IND STRL 7.0 (GLOVE) ×2 IMPLANT
GLOVE BIOGEL PI INDICATOR 7.0 (GLOVE) ×4
GOWN STRL REUS W/TWL LRG LVL3 (GOWN DISPOSABLE) ×6 IMPLANT
HOVERMATT SINGLE USE (MISCELLANEOUS) ×3 IMPLANT
KIT ABG SYR 3ML LUER SLIP (SYRINGE) IMPLANT
NEEDLE HYPO 22GX1.5 SAFETY (NEEDLE) IMPLANT
NEEDLE HYPO 25X5/8 SAFETYGLIDE (NEEDLE) IMPLANT
NS IRRIG 1000ML POUR BTL (IV SOLUTION) ×3 IMPLANT
PACK C SECTION WH (CUSTOM PROCEDURE TRAY) ×3 IMPLANT
PAD ABD 7.5X8 STRL (GAUZE/BANDAGES/DRESSINGS) ×3 IMPLANT
PAD OB MATERNITY 4.3X12.25 (PERSONAL CARE ITEMS) ×3 IMPLANT
PENCIL SMOKE EVAC W/HOLSTER (ELECTROSURGICAL) ×3 IMPLANT
SPONGE GAUZE 4X4 12PLY STER LF (GAUZE/BANDAGES/DRESSINGS) ×6 IMPLANT
STRIP CLOSURE SKIN 1/2X4 (GAUZE/BANDAGES/DRESSINGS) ×2 IMPLANT
SUT MON AB 4-0 PS1 27 (SUTURE) ×3 IMPLANT
SUT PLAIN 0 NONE (SUTURE) IMPLANT
SUT PLAIN 2 0 XLH (SUTURE) ×3 IMPLANT
SUT VIC AB 0 CT1 36 (SUTURE) ×6 IMPLANT
SUT VIC AB 0 CTX 36 (SUTURE) ×6
SUT VIC AB 0 CTX36XBRD ANBCTRL (SUTURE) ×3 IMPLANT
SUT VIC AB 2-0 CT1 27 (SUTURE) ×2
SUT VIC AB 2-0 CT1 TAPERPNT 27 (SUTURE) ×1 IMPLANT
SUT VIC AB 4-0 KS 27 (SUTURE) ×3 IMPLANT
SYR CONTROL 10ML LL (SYRINGE) IMPLANT
TOWEL OR 17X24 6PK STRL BLUE (TOWEL DISPOSABLE) ×3 IMPLANT
TRAY FOLEY W/BAG SLVR 14FR LF (SET/KITS/TRAYS/PACK) IMPLANT

## 2017-12-12 NOTE — Progress Notes (Signed)
Results for MARIENE, DICKERMAN (MRN [id number]) as of 12/12/2017 20:50  Ref. Range 12/12/2017 20:08  Alkaline Phosphatase Latest Ref Range: 38 - 126 U/L 67  AST Latest Ref Range: 15 - 41 U/L 286 (H)  ALT Latest Ref Range: 14 - 54 U/L 237 (H)    Dr Ernestina Penna notified of results, no new orders.

## 2017-12-12 NOTE — Transfer of Care (Signed)
Immediate Anesthesia Transfer of Care Note  Patient: Brianna Taylor  Procedure(s) Performed: CESAREAN SECTION (N/A )  Patient Location: PACU  Anesthesia Type:Spinal  Level of Consciousness: awake, oriented and patient cooperative  Airway & Oxygen Therapy: Patient Spontanous Breathing  Post-op Assessment: Report given to RN and Post -op Vital signs reviewed and stable  Post vital signs: Reviewed and stable  Last Vitals:  Vitals Value Taken Time  BP 134/109 12/12/2017 11:22 AM  Temp    Pulse 72 12/12/2017 11:27 AM  Resp 18 12/12/2017 11:27 AM  SpO2 97 % 12/12/2017 11:27 AM  Vitals shown include unvalidated device data.  Last Pain:  Vitals:   12/12/17 0728  TempSrc: Oral  PainSc: 2       Patients Stated Pain Goal: 5 (12/11/17 2137)  Complications: No apparent anesthesia complications

## 2017-12-12 NOTE — Brief Op Note (Signed)
12/12/2017  2:43 PM  PATIENT:  Brianna Taylor  37 y.o. female  PRE-OPERATIVE DIAGNOSIS:  worsening HELLP; severe preeclampsia; remote from delivery   POST-OPERATIVE DIAGNOSIS:  worsening HELLP; severe preeclampsia; remote from delivery   PROCEDURE:  Procedure(s): CESAREAN SECTION (N/A)  Primary low-transverse cesarean section with 2 layer closure  SURGEON:  Surgeon(s) and Role:    Noland Fordyce, MD - Primary  PHYSICIAN ASSISTANT:   ASSISTANTS: Carlean Jews, CNM   ANESTHESIA:   spinal  EBL:  526 mL   BLOOD ADMINISTERED:none  DRAINS: Urinary Catheter (Foley)   LOCAL MEDICATIONS USED:  NONE  SPECIMEN:  Source of Specimen:  placenta  DISPOSITION OF SPECIMEN:  labor and delivery  COUNTS:  YES  TOURNIQUET:  * No tourniquets in log *  DICTATION: .Note written in EPIC  PLAN OF CARE: Admit to inpatient   PATIENT DISPOSITION:  PACU - hemodynamically stable.   Delay start of Pharmacological VTE agent (>24hrs) due to surgical blood loss or risk of bleeding: yes

## 2017-12-12 NOTE — Progress Notes (Signed)
Bleeding of liquid consistency & mod amt noted when pt stood to use restroom.  No active vag bleeding noted on visual inspection upon return to bed.  Will con't to monitor.

## 2017-12-12 NOTE — Anesthesia Postprocedure Evaluation (Signed)
Anesthesia Post Note  Patient: Brianna Taylor  Procedure(s) Performed: CESAREAN SECTION (N/A )     Patient location during evaluation: PACU Anesthesia Type: Spinal Level of consciousness: awake and alert, oriented and patient cooperative Pain management: pain level controlled Vital Signs Assessment: post-procedure vital signs reviewed and stable Respiratory status: spontaneous breathing, nonlabored ventilation and respiratory function stable Cardiovascular status: blood pressure returned to baseline and stable Postop Assessment: patient able to bend at knees, spinal receding, adequate PO intake and no apparent nausea or vomiting Anesthetic complications: no    Last Vitals:  Vitals:   12/12/17 1230 12/12/17 1253  BP: 131/72 136/88  Pulse: 77 81  Resp: 16 (!) 1  Temp:  36.8 C  SpO2: 95% 92%    Last Pain:  Vitals:   12/12/17 1305  TempSrc:   PainSc: 0-No pain   Pain Goal: Patients Stated Pain Goal: 5 (12/11/17 2137)               Erling Cruz. Jax Kentner

## 2017-12-12 NOTE — Anesthesia Procedure Notes (Signed)
Spinal  Patient location during procedure: OR End time: 12/12/2017 9:48 AM Staffing Anesthesiologist: Jairo Ben, MD Performed: anesthesiologist  Preanesthetic Checklist Completed: patient identified, surgical consent, pre-op evaluation, timeout performed, IV checked, risks and benefits discussed and monitors and equipment checked Spinal Block Patient position: sitting Prep: site prepped and draped and DuraPrep Patient monitoring: blood pressure, continuous pulse ox, heart rate and cardiac monitor Approach: midline Location: L3-4 Injection technique: single-shot Needle Needle type: Pencan  Needle gauge: 24 G Needle length: 9 cm Additional Notes Pt identified in Operating room.  Monitors applied. Working IV access confirmed. Sterile prep, drape lumbar spine.  1% lido local L 3,4.  #24ga Pencan into clear CSF L 3,4.   0.75% Bupivacaine with dextrose, fentanyl, morphine injected with asp CSF beginning and end of injection.  Patient asymptomatic, VSS, no heme aspirated, tolerated well.  Sandford Craze, MD

## 2017-12-12 NOTE — Op Note (Signed)
12/12/2017  2:43 PM  PATIENT:  Brianna Taylor  37 y.o. female  PRE-OPERATIVE DIAGNOSIS:  worsening HELLP; severe preeclampsia; remote from delivery   POST-OPERATIVE DIAGNOSIS:  worsening HELLP; severe preeclampsia; remote from delivery   PROCEDURE:  Procedure(s): CESAREAN SECTION (N/A)  Primary low-transverse cesarean section with 2 layer closure  SURGEON:  Surgeon(s) and Role:    Noland Fordyce, MD - Primary  PHYSICIAN ASSISTANT:   ASSISTANTS: Carlean Jews, CNM   ANESTHESIA:   spinal  EBL:  526 mL   BLOOD ADMINISTERED:none  DRAINS: Urinary Catheter (Foley)   LOCAL MEDICATIONS USED:  NONE  SPECIMEN:  Source of Specimen:  placenta  DISPOSITION OF SPECIMEN:  labor and delivery  COUNTS:  YES  TOURNIQUET:  * No tourniquets in log *  DICTATION: .Note written in EPIC  PLAN OF CARE: Admit to inpatient   PATIENT DISPOSITION:  PACU - hemodynamically stable.   Delay start of Pharmacological VTE agent (>24hrs) due to surgical blood loss or risk of bleeding: yes     Findings:  @ infant,  APGAR (1 MIN): 8   APGAR (5 MINS): 9   APGAR (10 MINS):   Normal uterus, tubes and ovaries, normal placenta. 3VC, clear amniotic fluid  EBL: 500 cc Antibiotics:   2g Ancef Complications: none  Indications: This is a 37 y.o. year-old, G1  At [redacted]w[redacted]d admitted for induction of labor due to severe preeclampsia with concern for pending hellp syndrome. Given mostly nonsevere blood pressures and mildly abnormal labs patient was given an attempt at vaginal delivery. She was given Cytotec 3 for cervical ripening. The third Cytotec was placed early this morning given patient still had a closed cervix. She was not having any symptoms of severe preeclampsia and her blood pressures were mostly mild range. However her a.m. Labs returned with a abrupt drop in her platelets from 170-103 and an acute elevation in her liver function tests. Given her worsening status and remote from delivery  decision was made to proceed with primary cesarean. Risks benefits and alternatives of the procedure were discussed with the patient who agreed to proceed  Procedure:  After informed consent was obtained the patient was taken to the operating room where spinal anesthesia was initiated.  She was prepped and draped in the normal sterile fashion in dorsal supine position with a leftward tilt.  A foley catheter was in place.  A Pfannenstiel skin incision was made 2 cm above the pubic symphysis in the midline with the scalpel.  Dissection was carried down with the Bovie cautery until the fascia was reached. The fascia was incised in the midline. The incision was extended laterally with the Mayo scissors. The inferior aspect of the fascial incision was grasped with the Coker clamps, elevated up and the underlying rectus muscles were dissected off sharply. The superior aspect of the fascial incision was grasped with the Coker clamps elevated up and the underlying rectus muscles were dissected off sharply.  The peritoneum was entered bluntly. The peritoneal incision was extended superiorly and inferiorly with good visualization of the bladder. The bladder blade was inserted and palpation was done to assess the fetal position and the location of the uterine vessels. The lower segment of the uterus was incised sharply with the scalpel and extended  bluntly in the cephalo-caudal fashion. The infant was grasped, brought to the incision,  rotated and the infant was delivered with fundal pressure. The nose and mouth were bulb suctioned. The cord was clamped and cut after 1  minute delay. The infant was handed off to the waiting pediatrician. The placenta was expressed. The uterus was exteriorized. The uterus was cleared of all clots and debris. The uterine incision was repaired with 0 Vicryl in a running locked fashion.  A second layer of the same suture was used in an imbricating fashion to obtain excellent hemostasis.  The  uterus was then returned to the abdomen, the gutters were cleared of all clots and debris. The uterine incision was reinspected and found to be hemostatic. The peritoneum was grasped and closed with 2-0 Vicryl in a running fashion. The cut muscle edges and the underside of the fascia were inspected and found to be hemostatic. The fascia was closed with 0 Vicryl in a single layer . The subcutaneous tissue was irrigated. Scarpa's layer was closed with a 2-0 plain gut suture. The skin was closed with an [place] Irish needle The patient tolerated the procedure well. Sponge lap and needle counts were correct x3 and patient was taken to the recovery room in a stable condition.  Lendon Colonel 12/12/2017 2:44 PM

## 2017-12-12 NOTE — Anesthesia Preprocedure Evaluation (Addendum)
Anesthesia Evaluation  Patient identified by MRN, date of birth, ID band Patient awake    Reviewed: Allergy & Precautions, NPO status , Patient's Chart, lab work & pertinent test results  History of Anesthesia Complications Negative for: history of anesthetic complications  Airway Mallampati: II  TM Distance: >3 FB Neck ROM: Full    Dental  (+) Dental Advisory Given   Pulmonary neg pulmonary ROS,    breath sounds clear to auscultation       Cardiovascular hypertension (PIH), Pt. on medications  Rhythm:Regular Rate:Normal     Neuro/Psych negative neurological ROS     GI/Hepatic GERD  Medicated,HELLP:  Elevated LFTs   Endo/Other  diabetes (glu 149), Gestational  Renal/GU negative Renal ROS     Musculoskeletal   Abdominal (+) + obese,   Peds  Hematology plt 104k,    Anesthesia Other Findings   Reproductive/Obstetrics (+) Pregnancy                           Anesthesia Physical Anesthesia Plan  ASA: III  Anesthesia Plan: Spinal   Post-op Pain Management:    Induction:   PONV Risk Score and Plan: 2 and Treatment may vary due to age or medical condition, Scopolamine patch - Pre-op and Ondansetron  Airway Management Planned: Natural Airway  Additional Equipment:   Intra-op Plan:   Post-operative Plan:   Informed Consent: I have reviewed the patients History and Physical, chart, labs and discussed the procedure including the risks, benefits and alternatives for the proposed anesthesia with the patient or authorized representative who has indicated his/her understanding and acceptance.   Dental advisory given  Plan Discussed with: CRNA and Surgeon  Anesthesia Plan Comments:        Anesthesia Quick Evaluation

## 2017-12-12 NOTE — Progress Notes (Signed)
CNM [employer] notified of blood in foley catheter. RN instructed to continue to monitor.

## 2017-12-12 NOTE — Progress Notes (Signed)
Inpatient Diabetes Program Recommendations  AACE/ADA: New Consensus Statement on Inpatient Glycemic Control (2019)  Target Ranges:  Prepandial:   less than 140 mg/dL      Peak postprandial:   less than 180 mg/dL (1-2 hours)      Critically ill patients:  140 - 180 mg/dL   Results for NASTACIA, RAYBUCK (MRN [id number]) as of 12/12/2017 10:08  Ref. Range 12/11/2017 23:27 12/12/2017 03:51 12/12/2017 07:47  Glucose-Capillary Latest Ref Range: 65 - 99 mg/dL 119 (H) 147 (H) 829 (H)   Results for RONIQUA, KINTZ (MRN [id number]) as of 12/12/2017 10:08  Ref. Range 12/11/2017 18:50 12/12/2017 07:08  Glucose Latest Ref Range: 65 - 99 mg/dL 784 (H) 696 (H)   Review of Glycemic Control  Diabetes history: DM2 Outpatient Diabetes medications: Basaglar 22 units QHS Current orders for Inpatient glycemic control: None  Inpatient Diabetes Program Recommendations: Correction (SSI): Following delivery, please use Glycemic Control order set to order CBGs with Novolog 0-9 units TID with meals and Novolog 0-5 units QHS. Oral Agents: Following delivery, may want to consider resuming Metformin 500 mg BID (per chart review patient was taking prior to pregnancy).  NOTE: In reviewing chart noted patient received Lantus 18 units at 00:29 on 12/12/17. No other DM medications ordered at this time. Per chart review, patient has DM2 hx and was taking Metformin 500 mg BID prior to pregnancy.   Thanks, Orlando Penner, RN, MSN, CDE Diabetes Coordinator Inpatient Diabetes Program [phone] (Team Pager from 8am to 5pm)

## 2017-12-13 ENCOUNTER — Encounter (HOSPITAL_COMMUNITY): Payer: Self-pay | Admitting: Obstetrics

## 2017-12-13 LAB — COMPREHENSIVE METABOLIC PANEL
ALT: 189 U/L — ABNORMAL HIGH (ref 14–54)
AST: 134 U/L — ABNORMAL HIGH (ref 15–41)
Albumin: 2.4 g/dL — ABNORMAL LOW (ref 3.5–5.0)
Alkaline Phosphatase: 69 U/L (ref 38–126)
Anion gap: 9 (ref 5–15)
BUN: 9 mg/dL (ref 6–20)
CHLORIDE: 94 mmol/L — AB (ref 101–111)
CO2: 24 mmol/L (ref 22–32)
CREATININE: 0.6 mg/dL (ref 0.44–1.00)
Calcium: 7 mg/dL — ABNORMAL LOW (ref 8.9–10.3)
Glucose, Bld: 144 mg/dL — ABNORMAL HIGH (ref 65–99)
POTASSIUM: 4.5 mmol/L (ref 3.5–5.1)
Sodium: 127 mmol/L — ABNORMAL LOW (ref 135–145)
TOTAL PROTEIN: 5.9 g/dL — AB (ref 6.5–8.1)
Total Bilirubin: 1 mg/dL (ref 0.3–1.2)

## 2017-12-13 LAB — GLUCOSE, CAPILLARY
GLUCOSE-CAPILLARY: 131 mg/dL — AB (ref 65–99)
GLUCOSE-CAPILLARY: 180 mg/dL — AB (ref 65–99)
Glucose-Capillary: 148 mg/dL — ABNORMAL HIGH (ref 65–99)

## 2017-12-13 LAB — CBC
HCT: 30.2 % — ABNORMAL LOW (ref 36.0–46.0)
Hemoglobin: 10.4 g/dL — ABNORMAL LOW (ref 12.0–15.0)
MCH: 32.5 pg (ref 26.0–34.0)
MCHC: 34.4 g/dL (ref 30.0–36.0)
MCV: 94.4 fL (ref 78.0–100.0)
PLATELETS: 53 10*3/uL — AB (ref 150–400)
RBC: 3.2 MIL/uL — ABNORMAL LOW (ref 3.87–5.11)
RDW: 15.2 % (ref 11.5–15.5)
WBC: 12.1 10*3/uL — ABNORMAL HIGH (ref 4.0–10.5)

## 2017-12-13 MED ORDER — SODIUM CHLORIDE 0.9% FLUSH
3.0000 mL | Freq: Two times a day (BID) | INTRAVENOUS | Status: DC
  Administered 2017-12-13: 3 mL via INTRAVENOUS

## 2017-12-13 MED ORDER — SODIUM CHLORIDE 0.9% FLUSH: 3.0000 mL | INTRAVENOUS | Status: DC | PRN

## 2017-12-13 NOTE — Progress Notes (Signed)
Inpatient Diabetes Program Recommendations  AACE/ADA: New Consensus Statement on Inpatient Glycemic Control (2015)  Target Ranges:  Prepandial:   less than 140 mg/dL      Peak postprandial:   less than 180 mg/dL (1-2 hours)      Critically ill patients:  140 - 180 mg/dL   Lab Results  Component Value Date   GLUCAP 131 (H) 12/13/2017    Review of Glycemic Control Results for WHITNIE, DELEON (MRN [id number]) as of 12/13/2017 09:06  Ref. Range 12/12/2017 10:17 12/12/2017 12:16 12/12/2017 20:35 12/13/2017 06:37  Glucose-Capillary Latest Ref Range: 65 - 99 mg/dL 086 (H) 578 (H) 469 (H) 131 (H)    Diabetes history: DM2 Outpatient Diabetes medications: Basaglar 22 units QHS Current orders for Inpatient glycemic control: None  Inpatient Diabetes Program Recommendations:  Per previous coordinators note:   Correction (SSI): Following delivery, please use Glycemic Control order set to order CBGs with Novolog 0-9 units TID with meals and Novolog 0-5 units QHS. Oral Agents: Following delivery, may want to consider resuming Metformin 500 mg BID (per chart review patient was taking prior to pregnancy).  Thanks, Lujean Rave, MSN, RNC-OB Diabetes Coordinator [phone] (8a-5p)

## 2017-12-13 NOTE — Progress Notes (Addendum)
Post Partum Day 1 C/S for HELLP Subjective: no complaints, tolerating PO and + flatus  Objective: Blood pressure 126/73, pulse (!) 104, temperature 98.3 F (36.8 C), temperature source Oral, resp. rate 18, height  (1.753 m), weight 116.6 kg (257 lb), SpO2 98 %, unknown if currently breastfeeding.  Physical Exam:  General: alert, cooperative and appears stated age  Lungs:CTA CV: RRR Lochia: appropriate Uterine Fundus: firm Incision: no significant drainage, no dehiscence, no significant erythema DVT Evaluation: No evidence of DVT seen on physical exam. Negative Homan's sign. No cords or calf tenderness.  CBC    Component Value Date/Time   WBC 12.1 (H) 12/13/2017 0553   RBC 3.20 (L) 12/13/2017 0553   HGB 10.4 (L) 12/13/2017 0553   HCT 30.2 (L) 12/13/2017 0553   PLT 53 (L) 12/13/2017 0553   MCV 94.4 12/13/2017 0553   MCH 32.5 12/13/2017 0553   MCHC 34.4 12/13/2017 0553   RDW 15.2 12/13/2017 0553   LYMPHSABS 2.0 12/12/2017 1717   MONOABS 0.4 12/12/2017 1717   EOSABS 0.0 12/12/2017 1717   BASOSABS 0.0 12/12/2017 1717   CMP     Component Value Date/Time   NA 127 (L) 12/13/2017 0553   K 4.5 12/13/2017 0553   CL 94 (L) 12/13/2017 0553   CO2 24 12/13/2017 0553   GLUCOSE 144 (H) 12/13/2017 0553   BUN 9 12/13/2017 0553   CREATININE 0.60 12/13/2017 0553   CALCIUM 7.0 (L) 12/13/2017 0553   PROT 5.9 (L) 12/13/2017 0553   ALBUMIN 2.4 (L) 12/13/2017 0553   AST 134 (H) 12/13/2017 0553   ALT 189 (H) 12/13/2017 0553   ALKPHOS 69 12/13/2017 0553   BILITOT 1.0 12/13/2017 0553   GFRNONAA >60 12/13/2017 0553   GFRAA >60 12/13/2017 0553     Assessment/Plan:\ POD #1 s/p csection for HELLP- BP and labs stable. UO excellent. On Mag prophylaxis Continue Magnesium at this time Monitor VS and bleeding closely DC Mag at 24 hr this am   LOS: 2 days   Asa Baudoin J 12/13/2017, 9:17 AM

## 2017-12-14 LAB — COMPREHENSIVE METABOLIC PANEL
ALT: 115 U/L — AB (ref 14–54)
AST: 41 U/L (ref 15–41)
Albumin: 2.6 g/dL — ABNORMAL LOW (ref 3.5–5.0)
Alkaline Phosphatase: 82 U/L (ref 38–126)
Anion gap: 14 (ref 5–15)
BUN: 11 mg/dL (ref 6–20)
CHLORIDE: 103 mmol/L (ref 101–111)
CO2: 21 mmol/L — ABNORMAL LOW (ref 22–32)
CREATININE: 0.69 mg/dL (ref 0.44–1.00)
Calcium: 8.2 mg/dL — ABNORMAL LOW (ref 8.9–10.3)
GFR calc Af Amer: >60 mL/min (ref >60)
GLUCOSE: 208 mg/dL — AB (ref 65–99)
Potassium: 4.2 mmol/L (ref 3.5–5.1)
Sodium: 138 mmol/L (ref 135–145)
Total Bilirubin: 0.5 mg/dL (ref 0.3–1.2)
Total Protein: 6.1 g/dL — ABNORMAL LOW (ref 6.5–8.1)

## 2017-12-14 LAB — CBC
HEMATOCRIT: 32.5 % — AB (ref 36.0–46.0)
Hemoglobin: 10.5 g/dL — ABNORMAL LOW (ref 12.0–15.0)
MCH: 32.1 pg (ref 26.0–34.0)
MCHC: 32.3 g/dL (ref 30.0–36.0)
MCV: 99.4 fL (ref 78.0–100.0)
Platelets: 102 10*3/uL — ABNORMAL LOW (ref 150–400)
RBC: 3.27 MIL/uL — ABNORMAL LOW (ref 3.87–5.11)
RDW: 15.6 % — ABNORMAL HIGH (ref 11.5–15.5)
WBC: 15.9 10*3/uL — ABNORMAL HIGH (ref 4.0–10.5)

## 2017-12-14 LAB — GLUCOSE, CAPILLARY
GLUCOSE-CAPILLARY: 107 mg/dL — AB (ref 65–99)
Glucose-Capillary: 121 mg/dL — ABNORMAL HIGH (ref 65–99)
Glucose-Capillary: 124 mg/dL — ABNORMAL HIGH (ref 65–99)
Glucose-Capillary: 135 mg/dL — ABNORMAL HIGH (ref 65–99)

## 2017-12-15 LAB — GLUCOSE, CAPILLARY
GLUCOSE-CAPILLARY: 110 mg/dL — AB (ref 65–99)
GLUCOSE-CAPILLARY: 149 mg/dL — AB (ref 65–99)

## 2017-12-15 MED ORDER — OXYCODONE-ACETAMINOPHEN 5-325 MG PO TABS: 1.0000 | ORAL_TABLET | ORAL | 0 refills | Status: AC | PRN

## 2017-12-15 MED ORDER — METFORMIN HCL ER 750 MG PO TB24
750.0000 mg | ORAL_TABLET | Freq: Every day | ORAL | Status: DC
  Administered 2017-12-15: 750 mg via ORAL
  Filled 2017-12-15 (×2): qty 1

## 2017-12-15 MED ORDER — METFORMIN HCL ER 750 MG PO TB24: 750.0000 mg | ORAL_TABLET | Freq: Every day | ORAL | 11 refills | Status: AC

## 2017-12-15 NOTE — Discharge Instructions (Signed)
Call if temperature greater than equal to 100.4, nothing per vagina for 4-6 weeks or severe nausea vomiting, increased incisional pain , drainage or redness in the incision site, no straining with bowel movements, showers no bath °

## 2017-12-15 NOTE — Progress Notes (Signed)
Post Partum Day 2 C/S for HELLP GDMA2  Subjective: no complaints, tolerating PO and + flatus. Ambulating and pain is well controlled.   Objective: Blood pressure 122/80, pulse 81, temperature (!) 97.5 F (36.4 C), temperature source Oral, resp. rate 16, height  (1.753 m), weight 257 lb (116.6 kg), SpO2 98 %, unknown if currently breastfeeding.  Physical Exam:  General: alert, cooperative and appears stated age  Lungs:CTA CV: RRR Lochia: appropriate Uterine Fundus: firm Incision: no significant drainage, no dehiscence, no significant erythema DVT Evaluation: No evidence of DVT seen on physical exam. Negative Homan's sign. No cords or calf tenderness.  CBC Latest Ref Rng & Units 12/14/2017 12/13/2017 12/12/2017  WBC 4.0 - 10.5 K/uL 15.9(H) 12.1(H) 12.3(H)  Hemoglobin 12.0 - 15.0 g/dL 10.5(L) 10.4(L) 11.7(L)  Hematocrit 36.0 - 46.0 % 32.5(L) 30.2(L) 33.5(L)  Platelets 150 - 400 K/uL 102(L) 53(L) 52(L)   CMP Latest Ref Rng & Units 12/14/2017 12/13/2017 12/12/2017  Glucose 65 - 99 mg/dL 981(X) 914(N) -  BUN 6 - 20 mg/dL 11 9 -  Creatinine 8.29 - 1.00 mg/dL 5.62 1.30 -  Sodium 865 - 145 mmol/L 138 127(L) -  Potassium 3.5 - 5.1 mmol/L 4.2 4.5 -  Chloride 101 - 111 mmol/L 103 94(L) -  CO2 22 - 32 mmol/L 21(L) 24 -  Calcium 8.9 - 10.3 mg/dL 8.2(L) 7.0(L) -  Total Protein 6.5 - 8.1 g/dL 6.1(L) 5.9(L) -  Total Bilirubin 0.3 - 1.2 mg/dL 0.5 1.0 -  Alkaline Phos 38 - 126 U/L 82 69 67  AST 15 - 41 U/L 41 134(H) 286(H)  ALT 14 - 54 U/L 115(H) 189(H) 237(H)     Assessment/Plan:\ POD #2 s/p csection for HELLP- BP and labs improved.  Off magnesium and stable, Continue Magnesium at this time GDM- BS higher, adding back Metformin  Cont PP care. Anticipate DC tomorrow  Robley Fries

## 2017-12-15 NOTE — Discharge Summary (Signed)
OB Discharge Summary     Patient Name: Brianna Taylor DOB: [birth year] MRN: [id number]  Date of admission: 12/11/2017 Delivering MD: Noland Fordyce   Date of discharge: 12/15/2017  Admitting diagnosis: 35.5WKS RUQ PAIN  Intrauterine pregnancy: [redacted]w[redacted]d     Secondary diagnosis:  Principal Problem:   Postpartum care following cesarean delivery (5/7) Active Problems:   Preeclampsia, severe   Cesarean delivery delivered: indication worsening pre-eclampsia, remote from delivery  Additional problems: Class B DM     Discharge diagnosis: Preeclampsia (severe) and Class B DM, IUP @ 36 wk                                                                                                Post partum procedures:magnesium sulfate  Augmentation: Cytotec  Complications: None  Hospital course:  Induction of Labor With Cesarean Section  37 y.o. yo G1P0101 at [redacted]w[redacted]d was admitted to the hospital 12/11/2017 for induction of labor. Patient had a labor course significant for The patient went for cesarean section due to Elective Primary, and delivered a Viable infant,12/12/2017  Membrane Rupture Time/Date: 9:32 AM ,12/12/2017   Details of operation can be found in separate operative Note.  Patient had postpartum course complicated by persistent abnl labs. Pt continued on magnesium. And BP med as well as metformin With improved labs and improved BP . She is ambulating, tolerating a regular diet, passing flatus, and urinating well.  Patient is discharged home in stable condition on 12/19/17.                                    Physical exam  Vitals:   12/14/17 2351 12/15/17 0416 12/15/17 0751 12/15/17 1144  BP: 129/69 122/80 133/88 (!) 132/91  Pulse: 80 81 84 (!) 103  Resp: Temp: 98.3 F (36.8 C) (!) 97.5 F (36.4 C) 98 F (36.7 C)   TempSrc: Oral Oral Oral   SpO2: 98% 98% 98% 97%  Weight:      Height:       General: alert, cooperative and no distress Lochia: appropriate Uterine Fundus:  firm Incision: Healing well with no significant drainage DVT Evaluation: No evidence of DVT seen on physical exam. Labs: Lab Results  Component Value Date   WBC 15.9 (H) 12/14/2017   HGB 10.5 (L) 12/14/2017   HCT 32.5 (L) 12/14/2017   MCV 99.4 12/14/2017   PLT 102 (L) 12/14/2017   CMP Latest Ref Rng & Units 12/14/2017  Glucose 65 - 99 mg/dL 578(I)  BUN 6 - 20 mg/dL 11  Creatinine 6.96 - 2.95 mg/dL 2.84  Sodium 132 - 440 mmol/L 138  Potassium 3.5 - 5.1 mmol/L 4.2  Chloride 101 - 111 mmol/L 103  CO2 22 - 32 mmol/L 21(L)  Calcium 8.9 - 10.3 mg/dL 8.2(L)  Total Protein 6.5 - 8.1 g/dL 6.1(L)  Total Bilirubin 0.3 - 1.2 mg/dL 0.5  Alkaline Phos 38 - 126 U/L 82  AST 15 - 41 U/L 41  ALT 14 - 54 U/L 115(H)  Discharge instruction: per After Visit Summary and "Baby and Me Booklet".  After visit meds:    Diet: carb modified diet  Activity: Advance as tolerated. Pelvic rest for 6 weeks.   Outpatient follow up:1 wk Follow up Appt:No future appointments. Follow up Visit:No follow-ups on file.  Postpartum contraception: Not Discussed  Newborn Data: Live born female  Birth Weight: 6 lb 3.8 oz (2830 g) APGAR: 8, 9  Newborn Delivery   Birth date/time:  12/12/2017 10:23:00 Delivery type:  C-Section, Low Transverse Trial of labor:  No C-section categorization:  Primary     Baby Feeding: Bottle Disposition:home with mother   12/15/2017 Serita Kyle, MD

## 2017-12-15 NOTE — Progress Notes (Signed)
Pt out with  Family  Teaching complete

## 2017-12-15 NOTE — Progress Notes (Signed)
SVD: primary  S:  Pt reports feeling well/ Tolerating po/ Voiding without problems/ No n/v/ Bleeding is moderate/ Pain controlled withnarcotic analgesics including Percocet    O:  A & O x 3 / VS: Blood pressure (!) 132/91, pulse (!) 103, temperature 98 F (36.7 C), temperature source Oral, resp. rate 18, height  (1.753 m), weight 116.6 kg (257 lb), SpO2 97 %, unknown if currently breastfeeding.  LABS:  Results for orders placed or performed during the hospital encounter of 12/11/17 (from the past 24 hour(s))  Glucose, capillary     Status: Abnormal   Collection Time: 12/14/17  2:34 PM  Result Value Ref Range   Glucose-Capillary 124 (H) 65 - 99 mg/dL   Comment 1 Notify RN    Comment 2 Document in Chart   Glucose, capillary     Status: Abnormal   Collection Time: 12/14/17  9:03 PM  Result Value Ref Range   Glucose-Capillary 135 (H) 65 - 99 mg/dL  Glucose, capillary     Status: Abnormal   Collection Time: 12/15/17  5:27 AM  Result Value Ref Range   Glucose-Capillary 110 (H) 65 - 99 mg/dL  Glucose, capillary     Status: Abnormal   Collection Time: 12/15/17 11:43 AM  Result Value Ref Range   Glucose-Capillary 149 (H) 65 - 99 mg/dL    I&O: I/O last 3 completed shifts: In: 1700 [P.O.:1700] Out: 5750 [Urine:5750]   Total I/O In: 0  Out: 400 [Urine:400]  Lungs: chest clear, no wheezing, rales, normal symmetric air entry, Heart exam - S1, S2 normal, no murmur, no gallop, rate regular  Heart: regular rate and rhythm, S1, S2 normal, no murmur, click, rub or gallop  Abdomen: incision well-healed and uterus @ umb, firm nontender  Perineum: is normal  Lochia: mod  Extremities:edema 1+    A/P: POD # 3/PPD # 3/ G1P0101 Class B DM  S/p C/S for hellp syndrome/severe preeclampsia  Doing well  Continue routine post partum orders D/c home F/u one week Cont with BP med, metformin D/c instructions reviewed.  WOB booklet given

## 2017-12-21 ENCOUNTER — Inpatient Hospital Stay (HOSPITAL_COMMUNITY): Admission: RE | Admit: 2017-12-21 | Payer: Managed Care, Other (non HMO) | Source: Ambulatory Visit

## 2018-09-12 ENCOUNTER — Emergency Department (HOSPITAL_COMMUNITY)
Admission: EM | Admit: 2018-09-12 | Discharge: 2018-09-12 | Disposition: A | Discharge status: Home / Self Care | Payer: Managed Care, Other (non HMO) | Attending: Emergency Medicine | Admitting: Emergency Medicine

## 2018-09-12 ENCOUNTER — Emergency Department (HOSPITAL_COMMUNITY): Payer: Managed Care, Other (non HMO)

## 2018-09-12 DIAGNOSIS — R0789 Other chest pain: Secondary | ICD-10-CM | POA: Diagnosis present

## 2018-09-12 DIAGNOSIS — E119 Type 2 diabetes mellitus without complications: Secondary | ICD-10-CM | POA: Diagnosis not present

## 2018-09-12 DIAGNOSIS — Z794 Long term (current) use of insulin: Secondary | ICD-10-CM | POA: Insufficient documentation

## 2018-09-12 DIAGNOSIS — Z79899 Other long term (current) drug therapy: Secondary | ICD-10-CM | POA: Diagnosis not present

## 2018-09-12 LAB — BASIC METABOLIC PANEL
ANION GAP: 10 (ref 5–15)
BUN: 10 mg/dL (ref 6–20)
CALCIUM: 8.7 mg/dL — AB (ref 8.9–10.3)
CO2: 21 mmol/L — ABNORMAL LOW (ref 22–32)
CREATININE: 0.71 mg/dL (ref 0.44–1.00)
Chloride: 111 mmol/L (ref 98–111)
GFR calc Af Amer: >60 mL/min (ref >60)
Glucose, Bld: 157 mg/dL — ABNORMAL HIGH (ref 70–99)
Potassium: 3.4 mmol/L — ABNORMAL LOW (ref 3.5–5.1)
Sodium: 142 mmol/L (ref 135–145)

## 2018-09-12 LAB — CBC WITH DIFFERENTIAL/PLATELET
Abs Immature Granulocytes: 0.03 10*3/uL (ref 0.00–0.07)
BASOS PCT: 1 %
Basophils Absolute: 0.1 10*3/uL (ref 0.0–0.1)
EOS ABS: 0.2 10*3/uL (ref 0.0–0.5)
EOS PCT: 2 %
HEMATOCRIT: 42.5 % (ref 36.0–46.0)
Hemoglobin: 14.1 g/dL (ref 12.0–15.0)
Immature Granulocytes: 0 %
Lymphocytes Relative: 34 %
Lymphs Abs: 3.2 10*3/uL (ref 0.7–4.0)
MCH: 31.3 pg (ref 26.0–34.0)
MCHC: 33.2 g/dL (ref 30.0–36.0)
MCV: 94.2 fL (ref 80.0–100.0)
MONO ABS: 0.5 10*3/uL (ref 0.1–1.0)
MONOS PCT: 5 %
Neutro Abs: 5.7 10*3/uL (ref 1.7–7.7)
Neutrophils Relative %: 58 %
Platelets: 276 10*3/uL (ref 150–400)
RBC: 4.51 MIL/uL (ref 3.87–5.11)
RDW: 11.9 % (ref 11.5–15.5)
WBC: 9.7 10*3/uL (ref 4.0–10.5)
nRBC: 0 % (ref 0.0–0.2)

## 2018-09-12 LAB — I-STAT BETA HCG BLOOD, ED (MC, WL, AP ONLY): I-stat hCG, quantitative: <5 m[IU]/mL (ref <5)

## 2018-09-12 LAB — I-STAT TROPONIN, ED
TROPONIN I, POC: 0 ng/mL (ref 0.00–0.08)
Troponin i, poc: 0.01 ng/mL (ref 0.00–0.08)

## 2018-09-12 LAB — D-DIMER, QUANTITATIVE: D-Dimer, Quant: 0.48 ug/mL-FEU (ref 0.00–0.50)

## 2018-09-12 MED ORDER — POTASSIUM CHLORIDE CRYS ER 20 MEQ PO TBCR
20.0000 meq | EXTENDED_RELEASE_TABLET | Freq: Once | ORAL | Status: AC
  Administered 2018-09-12: 20 meq via ORAL
  Filled 2018-09-12: qty 1

## 2018-09-12 MED ORDER — KETOROLAC TROMETHAMINE 30 MG/ML IJ SOLN
15.0000 mg | Freq: Once | INTRAMUSCULAR | Status: AC
  Administered 2018-09-12: 15 mg via INTRAVENOUS
  Filled 2018-09-12: qty 1

## 2018-09-12 MED ORDER — SODIUM CHLORIDE 0.9 % IV BOLUS
1000.0000 mL | Freq: Once | INTRAVENOUS | Status: AC
  Administered 2018-09-12: 1000 mL via INTRAVENOUS

## 2018-09-12 NOTE — Discharge Instructions (Signed)
Please follow-up with your primary care provider. We were unable to find an exact cause of your chest pain.  However it does not appear that your heart enzymes, EKG or chest x-ray or abnormal.  We also completed a d-dimer that was able to rule out a pulmonary embolism. You can try Tylenol, ibuprofen or an antacid for relief of your symptoms. Return to the ED if you start to have worsening chest pain, leg swelling, increased shortness of breath, coughing up blood.

## 2018-09-12 NOTE — ED Notes (Signed)
Patient verbalizes understanding of discharge instructions. Opportunity for questioning and answers were provided. Armband removed by staff, pt discharged from ED.  

## 2018-09-12 NOTE — ED Triage Notes (Addendum)
Pt presented with chest pain and "mild weakness" to her left arm that began 1 week ago. Pt states the symptoms typically fade on their own without intervention but today they did not do so today. Pt is tearful and anxious at time of triage.

## 2018-09-12 NOTE — ED Provider Notes (Signed)
[facility] EMERGENCY DEPARTMENT Provider Note   CSN: [id number] Arrival date & time: 09/12/18  1410     History   Chief Complaint Chief Complaint  Patient presents with  . Chest Pain    HPI Brianna Taylor is a 38 y.o. female with a past medical history of type 2 diabetes, status post C-section delivery 9 months ago who presents to ED for gradually worsening left-sided chest pain radiating to left arm.  Her symptoms began 1 week ago and were intermittent.  She noticed the pain more when she would be relaxed or laying down.  She states that "I do know if I just got busy but I did notice it all the time."  For the past 24 hours, she has had constant left-sided chest pain which will radiate to her left arm.  States that her left arm feels heavy.  States that the pain will sometimes radiate down to her left lower ribs.  Denies shortness of breath.  No history of similar symptoms in the past.  No family history of cardiac disease at a young age.  She denies any URI symptoms, cough, fever, abdominal pain, nausea, vomiting, recent immobilization, prior MI, DVT, PE, leg swelling.  She has not tried any medications to help with her symptoms.  HPI  Past Medical History:  Diagnosis Date  . Diabetes mellitus without complication (HCC)    type 2 on insulin  . Vaginal Pap smear, abnormal     Patient Active Problem List   Diagnosis Date Noted  . Postpartum care following cesarean delivery (5/7) 12/12/2017  . Cesarean delivery delivered: indication worsening pre-eclampsia, remote from delivery 12/12/2017  . Preeclampsia, severe 12/11/2017  . Type 2 diabetes mellitus during pregnancy, unspecified trimester 07/06/2017  . Febrile neutropenia (HCC) 10/28/2013    Past Surgical History:  Procedure Laterality Date  . CESAREAN SECTION N/A 12/12/2017   Procedure: CESAREAN SECTION;  Surgeon: Noland Fordyce, MD;  Location: [facility] BIRTHING SUITES;  Service: Obstetrics;  Laterality: N/A;  .  COLPOSCOPY       OB History    Gravida  1   Para  1   Term      Preterm  1   AB      Living  1     SAB      TAB      Ectopic      Multiple  0   Live Births  1            Home Medications    Prior to Admission medications   Medication Sig Start Date End Date Taking? Authorizing Provider  calcium carbonate (TUMS - DOSED IN MG ELEMENTAL CALCIUM) 500 MG chewable tablet Chew 2 tablets by mouth 3 (three) times daily as needed for indigestion or heartburn.    [provider]  Insulin Glargine (BASAGLAR KWIKPEN) 100 UNIT/ML SOPN Inject 22 Units into the skin at bedtime. 08/25/17   [provider]  labetalol (NORMODYNE) 100 MG tablet Take 100 mg by mouth 3 (three) times daily. 12/11/17   [provider]  metFORMIN (GLUCOPHAGE-XR) 750 MG 24 hr tablet Take 1 tablet (750 mg total) by mouth daily with breakfast. 12/16/17   Maxie Better, MD  Prenatal Vit-Fe Fumarate-FA (PRENATAL MULTIVITAMIN) TABS tablet Take 1 tablet by mouth daily at 12 noon.    [provider]  raNITIdine HCl (ZANTAC PO) Take 1 tablet by mouth 2 (two) times daily.    [provider]  Family History No family history on file.  Social History Social History   Tobacco Use  . Smoking status: Never Smoker  . Smokeless tobacco: Never Used  Substance Use Topics  . Alcohol use: Yes    Comment: socially  . Drug use: Not on file     Allergies   Penicillins   Review of Systems Review of Systems  Constitutional: Negative for appetite change, chills and fever.  HENT: Negative for ear pain, rhinorrhea, sneezing and sore throat.   Eyes: Negative for photophobia and visual disturbance.  Respiratory: Negative for cough, chest tightness, shortness of breath and wheezing.   Cardiovascular: Positive for chest pain. Negative for palpitations.  Gastrointestinal: Negative for abdominal pain, blood in stool, constipation, diarrhea, nausea and vomiting.    Genitourinary: Negative for dysuria, hematuria and urgency.  Musculoskeletal: Negative for myalgias.  Skin: Negative for rash.  Neurological: Negative for dizziness, weakness and light-headedness.     Physical Exam Updated Vital Signs BP 113/72   Pulse 84   Temp 98.7 F (37.1 C) (Oral)   Resp (!) 27   Ht 5\' 9"  (1.753 m)   LMP 08/14/2018 (Within Days)   SpO2 98%   BMI 37.95 kg/m   Physical Exam Vitals signs and nursing note reviewed.  Constitutional:      General: She is not in acute distress.    Appearance: She is well-developed.  HENT:     Head: Normocephalic and atraumatic.     Nose: Nose normal.  Eyes:     General: No scleral icterus.       Left eye: No discharge.     Conjunctiva/sclera: Conjunctivae normal.  Neck:     Musculoskeletal: Normal range of motion and neck supple.  Cardiovascular:     Rate and Rhythm: Regular rhythm. Tachycardia present.     Heart sounds: Normal heart sounds. No murmur. No friction rub. No gallop.   Pulmonary:     Effort: Pulmonary effort is normal. No respiratory distress.     Breath sounds: Normal breath sounds.  Abdominal:     General: Bowel sounds are normal. There is no distension.     Palpations: Abdomen is soft.     Tenderness: There is no abdominal tenderness. There is no guarding.  Musculoskeletal: Normal range of motion.     Comments: No lower extremity edema, erythema or calf tenderness bilaterally.  Skin:    General: Skin is warm and dry.     Findings: No rash.  Neurological:     Mental Status: She is alert.     Motor: No abnormal muscle tone.     Coordination: Coordination normal.      ED Treatments / Results  Labs (all labs ordered are listed, but only abnormal results are displayed) Labs Reviewed  BASIC METABOLIC PANEL - Abnormal; Notable for the following components:      Result Value   Potassium 3.4 (*)    CO2 21 (*)    Glucose, Bld 157 (*)    Calcium 8.7 (*)    All other components within normal  limits  CBC WITH DIFFERENTIAL/PLATELET  D-DIMER, QUANTITATIVE (NOT [facility])  I-STAT TROPONIN, ED  I-STAT BETA HCG BLOOD, ED ([facility], [facility], [facility] ONLY)  I-STAT TROPONIN, ED    EKG EKG Interpretation  Date/Time:  Wednesday September 12 2018 14:18:33 EST Ventricular Rate:  102 PR Interval:    QRS Duration: 104 QT Interval:  349 QTC Calculation: 455 R Axis:   52 Text Interpretation:  Sinus tachycardia  Nonspecific T abnormalities, anterior leads no prior to compare with Confirmed by Meridee ScoreButler, Michael [phone]([id number]) on 09/12/2018 3:10:06 PM Also confirmed by Meridee ScoreButler, Michael [phone]), editor Lanae BoastBrewer, Glenn [phone]([id number])  on 09/12/2018 4:49:06 PM   Radiology Dg Chest 2 View  Result Date: 09/12/2018 CLINICAL DATA:  Chest pain for approximately 1 week EXAM: CHEST - 2 VIEW COMPARISON:  None. FINDINGS: Lungs are clear. Heart size and pulmonary vascularity are normal. No adenopathy. No pneumothorax. No bone lesions. IMPRESSION: No edema or consolidation. Electronically Signed   By: Bretta BangWilliam  Woodruff III M.D.   On: 09/12/2018 16:30    Procedures Procedures (including critical care time)  Medications Ordered in ED Medications  sodium chloride 0.9 % bolus 1,000 mL (1,000 mLs Intravenous New Bag/Given 09/12/18 1616)  potassium chloride SA (K-DUR,KLOR-CON) CR tablet 20 mEq (20 mEq Oral Given 09/12/18 1644)  ketorolac (TORADOL) 30 MG/ML injection 15 mg (15 mg Intravenous Given 09/12/18 1645)     Initial Impression / Assessment and Plan / ED Course  I have reviewed the triage vital signs and the nursing notes.  Pertinent labs & imaging results that were available during my care of the patient were reviewed by me and considered in my medical decision making (see chart for details).     38 year old female with a past medical history of type 2 diabetes, status post C-section delivery 9 months ago presents to ED for gradually worsening left-sided chest pain radiating to left arm.  Symptoms were intermittent for the past week until  the past 24 hours with a become constant.  Pain will radiate down her left lower ribs.  Patient is overall well-appearing on my exam.  Initially tachycardic to low 100s.  She is not hypoxic or tachypneic.  She is afebrile.  There is no lower extremity edema, erythema or calf tenderness that would concern me for DVT.  CBC, initial troponin, hCG unremarkable.  BMP with mild hypokalemia 3.4 which was repleted orally.  EKG shows sinus tachycardia with no other abnormalities.  Repeat troponin is negative.  D-dimer is negative.  Patient is low risk for MACE with a heart score of 1 (risk factors, could be 2 based on history, either way low risk).  Unsure if this could be stress related, musculoskeletal or GERD.  Patient given medications here.  We will have her follow-up with PCP and return to ED for any severe worsening symptoms.  Patient is hemodynamically stable, in NAD, and able to ambulate in the ED. Evaluation does not show pathology that would require ongoing emergent intervention or inpatient treatment. I explained the diagnosis to the patient. Pain has been managed and has no complaints prior to discharge. Patient is comfortable with above plan and is stable for discharge at this time. All questions were answered prior to disposition. Strict return precautions for returning to the ED were discussed. Encouraged follow up with PCP.    Portions of this note were generated with Scientist, clinical (histocompatibility and immunogenetics)Dragon dictation software. Dictation errors may occur despite best attempts at proofreading.   Final Clinical Impressions(s) / ED Diagnoses   Final diagnoses:  Chest wall pain    ED Discharge Orders    None       Dietrich PatesKhatri, Calleigh Lafontant, PA-C 09/12/18 1849    Terrilee FilesButler, Michael C, MD 09/13/18 1446

## 2020-04-02 LAB — OB RESULTS CONSOLE ANTIBODY SCREEN: Antibody Screen: NEGATIVE

## 2020-04-02 LAB — OB RESULTS CONSOLE ABO/RH: RH Type: POSITIVE

## 2020-04-02 LAB — OB RESULTS CONSOLE RUBELLA ANTIBODY, IGM: Rubella: IMMUNE

## 2020-05-13 LAB — OB RESULTS CONSOLE HIV ANTIBODY (ROUTINE TESTING): HIV: NONREACTIVE

## 2020-05-13 LAB — OB RESULTS CONSOLE RPR: RPR: NONREACTIVE

## 2020-05-13 LAB — OB RESULTS CONSOLE HEPATITIS B SURFACE ANTIGEN: Hepatitis B Surface Ag: NEGATIVE

## 2020-06-14 IMAGING — DX DG CHEST 2V
2 series · 2 of 2 positions shown · non-contrast
Comparison: None.

CLINICAL DATA: Chest pain for approximately 1 week

EXAM:
CHEST - 2 VIEW

[chest pa]
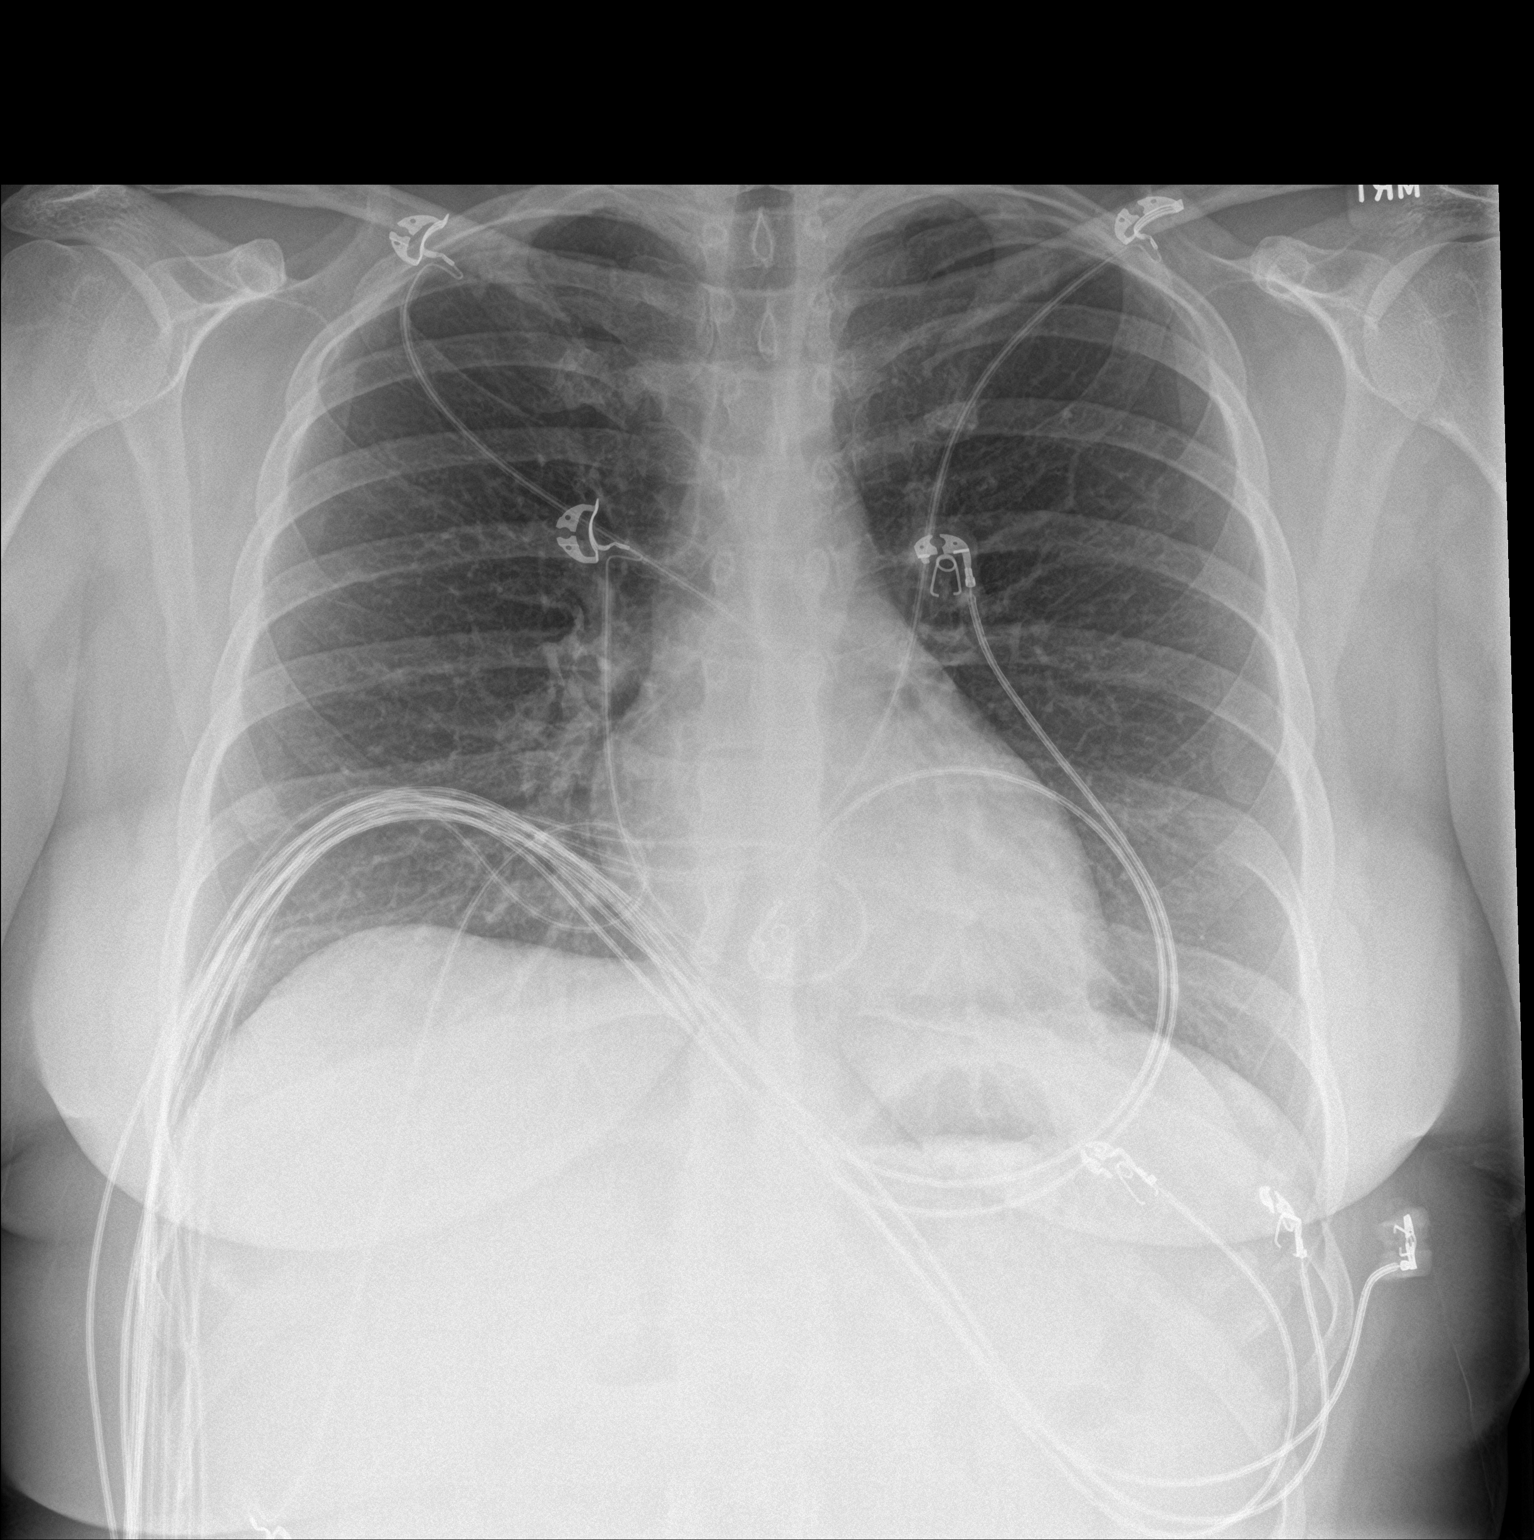

[chest lat]
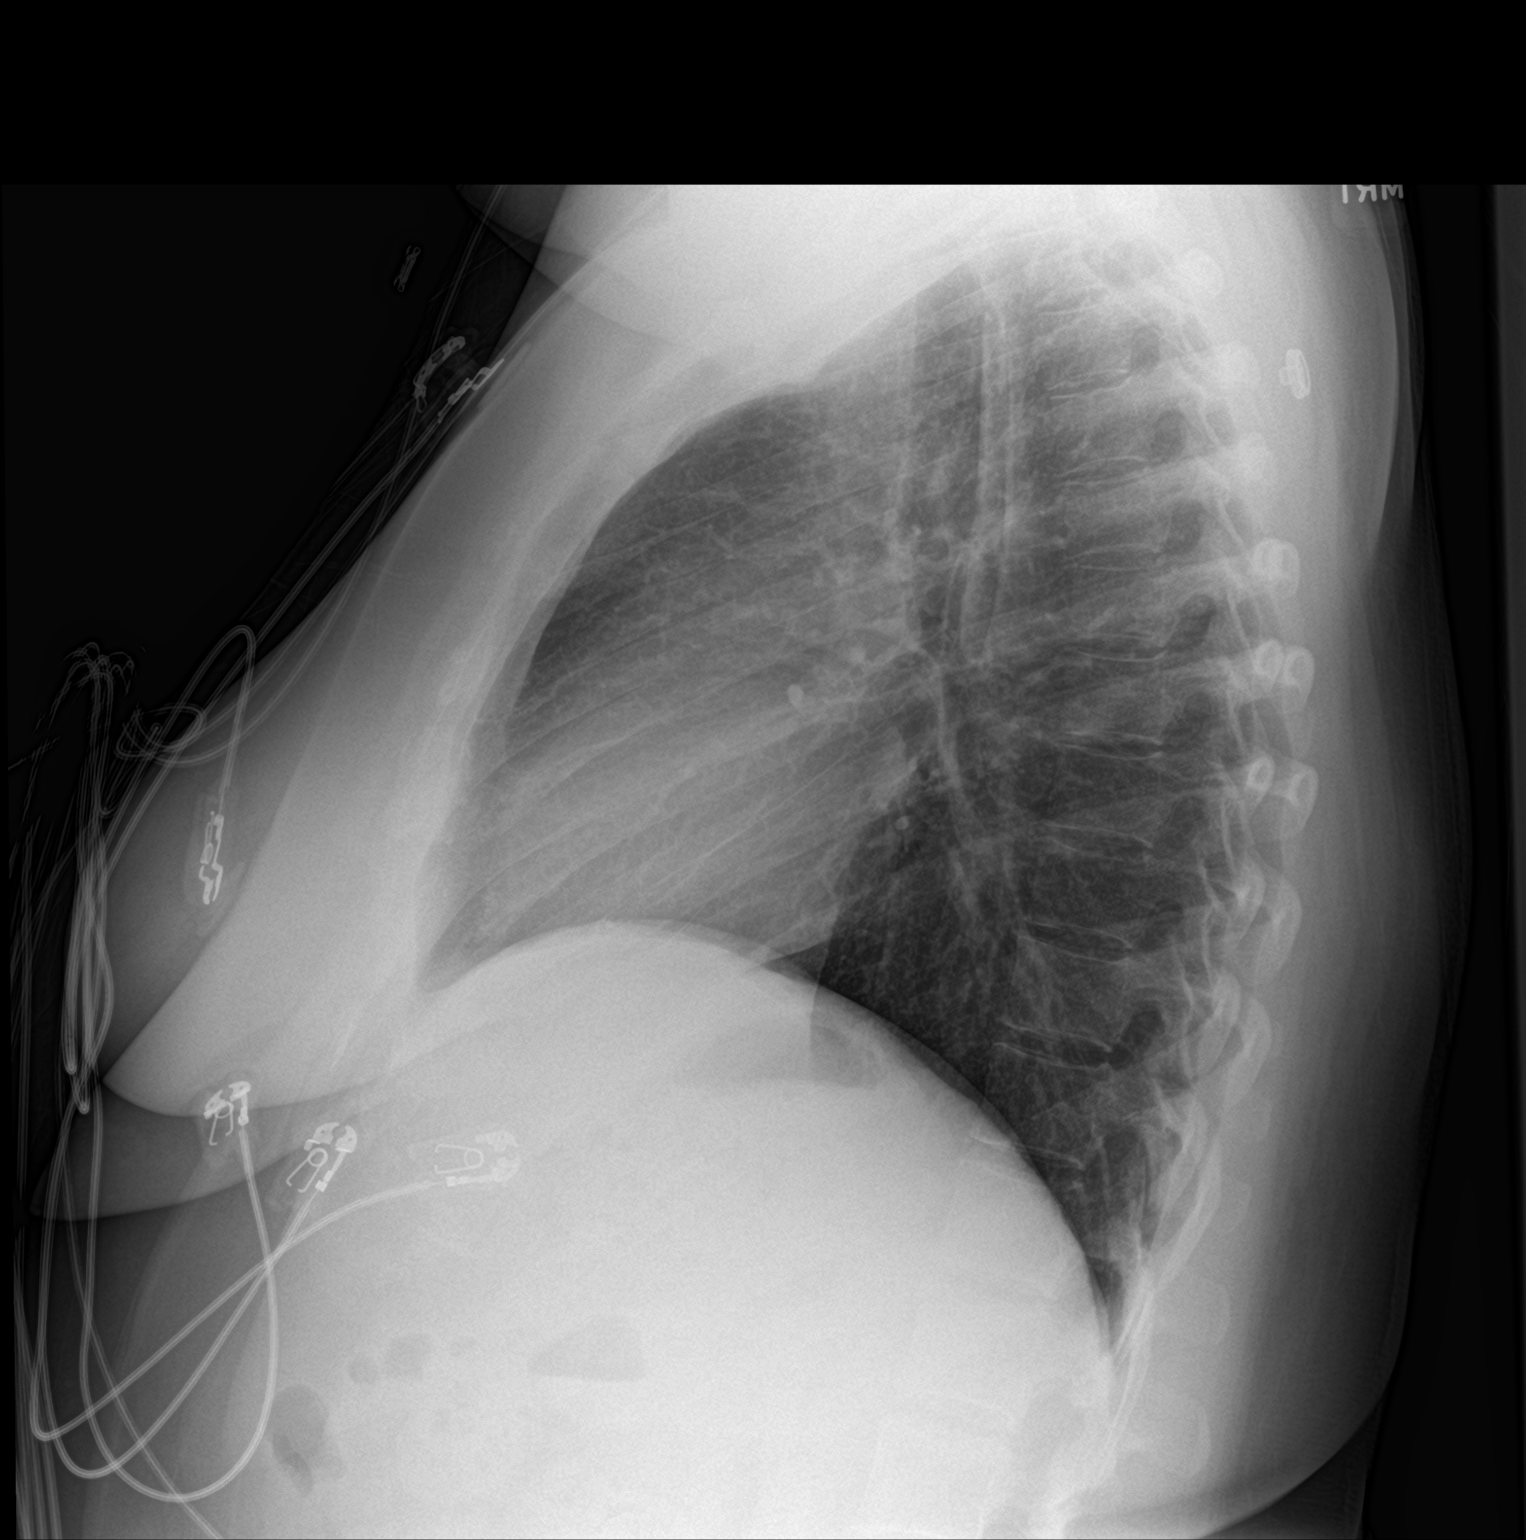

[2 of 2 positions shown; findings below may reference images not displayed]

FINDINGS: Lungs are clear. Heart size and pulmonary vascularity are normal. No
adenopathy. No pneumothorax. No bone lesions.
IMPRESSION: No edema or consolidation.

## 2020-11-12 ENCOUNTER — Encounter (HOSPITAL_COMMUNITY): Payer: Self-pay

## 2020-11-13 ENCOUNTER — Encounter (HOSPITAL_COMMUNITY): Payer: Self-pay

## 2020-11-13 ENCOUNTER — Telehealth (HOSPITAL_COMMUNITY): Payer: Self-pay | Admitting: *Deleted

## 2020-11-13 NOTE — Patient Instructions (Signed)
Brianna Taylor  11/13/2020   Your procedure is scheduled on:  11/26/2020  Arrive at 0930 at Entrance C on [address] at [facility]  and [employer]. You are invited to use the FREE valet parking or use the Visitor's parking deck.  Pick up the phone at the desk and dial [phone].  Call this number if you have problems the morning of surgery: [phone]  Remember:   Do not eat food:(After Midnight) Desps de medianoche.  Do not drink clear liquids: (After Midnight) Desps de medianoche.  Take these medicines the morning of surgery with A SIP OF WATER:  Take labetalol as prescribed and take 26 units of insulin at bedtime ( half of normal dose )    Do not wear jewelry, make-up or nail polish.  Do not wear lotions, powders, or perfumes. Do not wear deodorant.  Do not shave 48 hours prior to surgery.  Do not bring valuables to the hospital.  [facility] is not   responsible for any belongings or valuables brought to the hospital.  Contacts, dentures or bridgework may not be worn into surgery.  Leave suitcase in the car. After surgery it may be brought to your room.  For patients admitted to the hospital, checkout time is 11:00 AM the day of              discharge.      Please read over the following fact sheets that you were given:     Preparing for Surgery

## 2020-11-13 NOTE — Telephone Encounter (Signed)
Preadmission screen  

## 2020-11-16 ENCOUNTER — Other Ambulatory Visit: Payer: Self-pay | Admitting: Obstetrics

## 2020-11-24 ENCOUNTER — Encounter (HOSPITAL_COMMUNITY)
Admission: RE | Admit: 2020-11-24 | Discharge: 2020-11-24 | Disposition: A | Discharge status: Home / Self Care | Payer: Managed Care, Other (non HMO) | Source: Ambulatory Visit | Attending: Obstetrics | Admitting: Obstetrics

## 2020-11-24 ENCOUNTER — Other Ambulatory Visit (HOSPITAL_COMMUNITY)
Admission: RE | Admit: 2020-11-24 | Discharge: 2020-11-24 | Disposition: A | Discharge status: Home / Self Care | Payer: Managed Care, Other (non HMO) | Source: Ambulatory Visit | Attending: Obstetrics | Admitting: Obstetrics

## 2020-11-24 ENCOUNTER — Other Ambulatory Visit: Payer: Self-pay

## 2020-11-24 DIAGNOSIS — Z01812 Encounter for preprocedural laboratory examination: Secondary | ICD-10-CM | POA: Insufficient documentation

## 2020-11-24 DIAGNOSIS — Z20822 Contact with and (suspected) exposure to covid-19: Secondary | ICD-10-CM | POA: Insufficient documentation

## 2020-11-24 HISTORY — DX: Personal history of other complications of pregnancy, childbirth and the puerperium: Z87.59

## 2020-11-24 HISTORY — DX: Supervision of pregnancy with other poor reproductive or obstetric history, unspecified trimester: O09.299

## 2020-11-24 LAB — CBC
HCT: 38.7 % (ref 36.0–46.0)
Hemoglobin: 12.8 g/dL (ref 12.0–15.0)
MCH: 31.4 pg (ref 26.0–34.0)
MCHC: 33.1 g/dL (ref 30.0–36.0)
MCV: 94.9 fL (ref 80.0–100.0)
Platelets: 247 10*3/uL (ref 150–400)
RBC: 4.08 MIL/uL (ref 3.87–5.11)
RDW: 13.8 % (ref 11.5–15.5)
WBC: 9.3 10*3/uL (ref 4.0–10.5)
nRBC: 0 % (ref 0.0–0.2)

## 2020-11-24 LAB — SARS CORONAVIRUS 2 (TAT 6-24 HRS): SARS Coronavirus 2: NEGATIVE

## 2020-11-24 LAB — TYPE AND SCREEN
ABO/RH(D): A POS
Antibody Screen: NEGATIVE

## 2020-11-24 LAB — RPR: RPR Ser Ql: NONREACTIVE

## 2020-11-26 ENCOUNTER — Other Ambulatory Visit: Payer: Self-pay

## 2020-11-26 ENCOUNTER — Inpatient Hospital Stay (HOSPITAL_COMMUNITY)
Admission: AD | Admit: 2020-11-26 | Discharge: 2020-11-28 | DRG: 786 | Disposition: A | Discharge status: Home / Self Care | Payer: Managed Care, Other (non HMO) | Attending: Obstetrics | Admitting: Obstetrics

## 2020-11-26 ENCOUNTER — Encounter (HOSPITAL_COMMUNITY)
Admission: AD | Disposition: A | Discharge status: Home / Self Care | Payer: Self-pay | Source: Home / Self Care | Attending: Obstetrics

## 2020-11-26 ENCOUNTER — Inpatient Hospital Stay (HOSPITAL_COMMUNITY): Payer: Managed Care, Other (non HMO)

## 2020-11-26 ENCOUNTER — Encounter (HOSPITAL_COMMUNITY): Payer: Self-pay | Admitting: Obstetrics

## 2020-11-26 DIAGNOSIS — O34211 Maternal care for low transverse scar from previous cesarean delivery: Secondary | ICD-10-CM | POA: Diagnosis present

## 2020-11-26 DIAGNOSIS — O2412 Pre-existing diabetes mellitus, type 2, in childbirth: Secondary | ICD-10-CM | POA: Diagnosis present

## 2020-11-26 DIAGNOSIS — E119 Type 2 diabetes mellitus without complications: Secondary | ICD-10-CM | POA: Diagnosis present

## 2020-11-26 DIAGNOSIS — O9902 Anemia complicating childbirth: Secondary | ICD-10-CM | POA: Diagnosis present

## 2020-11-26 DIAGNOSIS — Z7984 Long term (current) use of oral hypoglycemic drugs: Secondary | ICD-10-CM | POA: Diagnosis not present

## 2020-11-26 DIAGNOSIS — O10919 Unspecified pre-existing hypertension complicating pregnancy, unspecified trimester: Secondary | ICD-10-CM | POA: Diagnosis present

## 2020-11-26 DIAGNOSIS — D649 Anemia, unspecified: Secondary | ICD-10-CM | POA: Diagnosis present

## 2020-11-26 DIAGNOSIS — O1002 Pre-existing essential hypertension complicating childbirth: Secondary | ICD-10-CM | POA: Diagnosis present

## 2020-11-26 DIAGNOSIS — Z3A38 38 weeks gestation of pregnancy: Secondary | ICD-10-CM | POA: Diagnosis not present

## 2020-11-26 DIAGNOSIS — O24119 Pre-existing diabetes mellitus, type 2, in pregnancy, unspecified trimester: Secondary | ICD-10-CM | POA: Diagnosis present

## 2020-11-26 DIAGNOSIS — Z20822 Contact with and (suspected) exposure to covid-19: Secondary | ICD-10-CM | POA: Diagnosis present

## 2020-11-26 DIAGNOSIS — Z98891 History of uterine scar from previous surgery: Secondary | ICD-10-CM

## 2020-11-26 DIAGNOSIS — O403XX0 Polyhydramnios, third trimester, not applicable or unspecified: Secondary | ICD-10-CM | POA: Diagnosis present

## 2020-11-26 DIAGNOSIS — Z141 Cystic fibrosis carrier: Secondary | ICD-10-CM

## 2020-11-26 LAB — GLUCOSE, CAPILLARY
Glucose-Capillary: 100 mg/dL — ABNORMAL HIGH (ref 70–99)
Glucose-Capillary: 85 mg/dL (ref 70–99)
Glucose-Capillary: 162 mg/dL — ABNORMAL HIGH (ref 70–99)
Glucose-Capillary: 138 mg/dL — ABNORMAL HIGH (ref 70–99)

## 2020-11-26 SURGERY — Surgical Case
Anesthesia: Spinal

## 2020-11-26 MED ORDER — PRENATAL MULTIVITAMIN CH
1.0000 | ORAL_TABLET | Freq: Every day | ORAL | Status: DC
  Administered 2020-11-26 – 2020-11-28 (×3): 1 via ORAL
  Filled 2020-11-26 (×2): qty 1

## 2020-11-26 MED ORDER — SCOPOLAMINE 1 MG/3DAYS TD PT72
MEDICATED_PATCH | TRANSDERMAL | Status: AC
  Filled 2020-11-26: qty 1

## 2020-11-26 MED ORDER — SCOPOLAMINE 1 MG/3DAYS TD PT72: 1.0000 | MEDICATED_PATCH | Freq: Once | TRANSDERMAL | Status: DC

## 2020-11-26 MED ORDER — NALBUPHINE HCL 10 MG/ML IJ SOLN: 5.0000 mg | Freq: Once | INTRAMUSCULAR | Status: DC | PRN

## 2020-11-26 MED ORDER — INSULIN GLARGINE 100 UNIT/ML ~~LOC~~ SOLN
20.0000 [IU] | Freq: Every day | SUBCUTANEOUS | Status: DC
  Administered 2020-11-26: 20 [IU] via SUBCUTANEOUS
  Filled 2020-11-26 (×2): qty 0.2

## 2020-11-26 MED ORDER — DIPHENHYDRAMINE HCL 50 MG/ML IJ SOLN: 12.5000 mg | INTRAMUSCULAR | Status: DC | PRN

## 2020-11-26 MED ORDER — ZOLPIDEM TARTRATE 5 MG PO TABS: 5.0000 mg | ORAL_TABLET | Freq: Every evening | ORAL | Status: DC | PRN

## 2020-11-26 MED ORDER — OXYTOCIN-SODIUM CHLORIDE 30-0.9 UT/500ML-% IV SOLN
INTRAVENOUS | Status: DC | PRN
  Administered 2020-11-26: 300 mL via INTRAVENOUS

## 2020-11-26 MED ORDER — SENNOSIDES-DOCUSATE SODIUM 8.6-50 MG PO TABS
2.0000 | ORAL_TABLET | ORAL | Status: DC
  Administered 2020-11-26 – 2020-11-27 (×2): 2 via ORAL
  Filled 2020-11-26: qty 2

## 2020-11-26 MED ORDER — FENTANYL CITRATE (PF) 100 MCG/2ML IJ SOLN
INTRAMUSCULAR | Status: AC
  Filled 2020-11-26: qty 2

## 2020-11-26 MED ORDER — NALBUPHINE HCL 10 MG/ML IJ SOLN: 5.0000 mg | INTRAMUSCULAR | Status: DC | PRN

## 2020-11-26 MED ORDER — SODIUM CHLORIDE 0.9 % IR SOLN
Status: DC | PRN
  Administered 2020-11-26: 1000 mL

## 2020-11-26 MED ORDER — KETOROLAC TROMETHAMINE 30 MG/ML IJ SOLN
30.0000 mg | Freq: Once | INTRAMUSCULAR | Status: AC | PRN
  Administered 2020-11-26: 30 mg via INTRAVENOUS

## 2020-11-26 MED ORDER — OXYCODONE HCL 5 MG PO TABS: 5.0000 mg | ORAL_TABLET | Freq: Once | ORAL | Status: DC | PRN

## 2020-11-26 MED ORDER — CEFAZOLIN SODIUM-DEXTROSE 2-4 GM/100ML-% IV SOLN
INTRAVENOUS | Status: AC
  Filled 2020-11-26: qty 100

## 2020-11-26 MED ORDER — SIMETHICONE 80 MG PO CHEW: 80.0000 mg | CHEWABLE_TABLET | ORAL | Status: DC | PRN

## 2020-11-26 MED ORDER — TETANUS-DIPHTH-ACELL PERTUSSIS 5-2.5-18.5 LF-MCG/0.5 IM SUSY: 0.5000 mL | PREFILLED_SYRINGE | Freq: Once | INTRAMUSCULAR | Status: DC

## 2020-11-26 MED ORDER — ONDANSETRON HCL 4 MG/2ML IJ SOLN: 4.0000 mg | Freq: Three times a day (TID) | INTRAMUSCULAR | Status: DC | PRN

## 2020-11-26 MED ORDER — HYDROMORPHONE HCL 1 MG/ML IJ SOLN: 0.2500 mg | INTRAMUSCULAR | Status: DC | PRN

## 2020-11-26 MED ORDER — PROMETHAZINE HCL 25 MG/ML IJ SOLN: 6.2500 mg | INTRAMUSCULAR | Status: DC | PRN

## 2020-11-26 MED ORDER — DIPHENHYDRAMINE HCL 25 MG PO CAPS: 25.0000 mg | ORAL_CAPSULE | ORAL | Status: DC | PRN

## 2020-11-26 MED ORDER — BUPIVACAINE IN DEXTROSE 0.75-8.25 % IT SOLN
INTRATHECAL | Status: DC | PRN
  Administered 2020-11-26: 1.6 mg via INTRATHECAL

## 2020-11-26 MED ORDER — WITCH HAZEL-GLYCERIN EX PADS: 1.0000 "application " | MEDICATED_PAD | CUTANEOUS | Status: DC | PRN

## 2020-11-26 MED ORDER — SIMETHICONE 80 MG PO CHEW
80.0000 mg | CHEWABLE_TABLET | Freq: Three times a day (TID) | ORAL | Status: DC
  Administered 2020-11-26 – 2020-11-28 (×5): 80 mg via ORAL
  Filled 2020-11-26 (×5): qty 1

## 2020-11-26 MED ORDER — MENTHOL 3 MG MT LOZG: 1.0000 | LOZENGE | OROMUCOSAL | Status: DC | PRN

## 2020-11-26 MED ORDER — OXYCODONE HCL 5 MG PO TABS
5.0000 mg | ORAL_TABLET | ORAL | Status: DC | PRN
Start: 2020-11-26 — End: 2020-11-28

## 2020-11-26 MED ORDER — PHENYLEPHRINE HCL-NACL 20-0.9 MG/250ML-% IV SOLN
INTRAVENOUS | Status: AC
  Filled 2020-11-26: qty 250

## 2020-11-26 MED ORDER — FENTANYL CITRATE (PF) 100 MCG/2ML IJ SOLN
INTRAMUSCULAR | Status: DC | PRN
  Administered 2020-11-26: 15 ug via INTRATHECAL

## 2020-11-26 MED ORDER — NALOXONE HCL 0.4 MG/ML IJ SOLN: 0.4000 mg | INTRAMUSCULAR | Status: DC | PRN

## 2020-11-26 MED ORDER — IBUPROFEN 600 MG PO TABS
600.0000 mg | ORAL_TABLET | Freq: Four times a day (QID) | ORAL | Status: DC
  Administered 2020-11-27 – 2020-11-28 (×5): 600 mg via ORAL
  Filled 2020-11-26 (×5): qty 1

## 2020-11-26 MED ORDER — LACTATED RINGERS IV SOLN: INTRAVENOUS | Status: DC

## 2020-11-26 MED ORDER — CEFAZOLIN SODIUM-DEXTROSE 2-4 GM/100ML-% IV SOLN
2.0000 g | INTRAVENOUS | Status: AC
  Administered 2020-11-26: 2 g via INTRAVENOUS

## 2020-11-26 MED ORDER — MEPERIDINE HCL 25 MG/ML IJ SOLN: 6.2500 mg | INTRAMUSCULAR | Status: DC | PRN

## 2020-11-26 MED ORDER — DEXAMETHASONE SODIUM PHOSPHATE 10 MG/ML IJ SOLN
INTRAMUSCULAR | Status: AC
  Filled 2020-11-26: qty 1

## 2020-11-26 MED ORDER — COCONUT OIL OIL: 1.0000 "application " | TOPICAL_OIL | Status: DC | PRN

## 2020-11-26 MED ORDER — BASAGLAR KWIKPEN 100 UNIT/ML ~~LOC~~ SOPN: 20.0000 [IU] | PEN_INJECTOR | Freq: Every day | SUBCUTANEOUS | Status: DC

## 2020-11-26 MED ORDER — MORPHINE SULFATE (PF) 0.5 MG/ML IJ SOLN
INTRAMUSCULAR | Status: AC
  Filled 2020-11-26: qty 10

## 2020-11-26 MED ORDER — LABETALOL HCL 100 MG PO TABS
100.0000 mg | ORAL_TABLET | Freq: Three times a day (TID) | ORAL | Status: DC
  Administered 2020-11-26 – 2020-11-28 (×3): 100 mg via ORAL
  Filled 2020-11-26 (×4): qty 1

## 2020-11-26 MED ORDER — DIBUCAINE (PERIANAL) 1 % EX OINT: 1.0000 "application " | TOPICAL_OINTMENT | CUTANEOUS | Status: DC | PRN

## 2020-11-26 MED ORDER — OXYCODONE HCL 5 MG/5ML PO SOLN: 5.0000 mg | Freq: Once | ORAL | Status: DC | PRN

## 2020-11-26 MED ORDER — SODIUM CHLORIDE 0.9% FLUSH
3.0000 mL | INTRAVENOUS | Status: DC | PRN
Start: 2020-11-26 — End: 2020-11-28

## 2020-11-26 MED ORDER — OXYTOCIN-SODIUM CHLORIDE 30-0.9 UT/500ML-% IV SOLN: 2.5000 [IU]/h | INTRAVENOUS | Status: AC

## 2020-11-26 MED ORDER — MORPHINE SULFATE (PF) 0.5 MG/ML IJ SOLN
INTRAMUSCULAR | Status: DC | PRN
  Administered 2020-11-26: 150 ug via INTRATHECAL

## 2020-11-26 MED ORDER — NALOXONE HCL 4 MG/10ML IJ SOLN
1.0000 ug/kg/h | INTRAVENOUS | Status: DC | PRN
  Filled 2020-11-26: qty 5

## 2020-11-26 MED ORDER — KETOROLAC TROMETHAMINE 30 MG/ML IJ SOLN
30.0000 mg | Freq: Four times a day (QID) | INTRAMUSCULAR | Status: AC
  Administered 2020-11-26 – 2020-11-27 (×2): 30 mg via INTRAVENOUS
  Filled 2020-11-26 (×2): qty 1

## 2020-11-26 MED ORDER — DIPHENHYDRAMINE HCL 25 MG PO CAPS: 25.0000 mg | ORAL_CAPSULE | Freq: Four times a day (QID) | ORAL | Status: DC | PRN

## 2020-11-26 MED ORDER — ONDANSETRON HCL 4 MG/2ML IJ SOLN
INTRAMUSCULAR | Status: AC
  Filled 2020-11-26: qty 2

## 2020-11-26 MED ORDER — PHENYLEPHRINE HCL-NACL 20-0.9 MG/250ML-% IV SOLN
INTRAVENOUS | Status: DC | PRN
  Administered 2020-11-26: 30 ug/min via INTRAVENOUS

## 2020-11-26 MED ORDER — ONDANSETRON HCL 4 MG/2ML IJ SOLN
INTRAMUSCULAR | Status: DC | PRN
  Administered 2020-11-26: 4 mg via INTRAVENOUS

## 2020-11-26 MED ORDER — ACETAMINOPHEN 500 MG PO TABS
1000.0000 mg | ORAL_TABLET | Freq: Four times a day (QID) | ORAL | Status: DC
  Administered 2020-11-26 – 2020-11-28 (×8): 1000 mg via ORAL
  Filled 2020-11-26 (×9): qty 2

## 2020-11-26 MED ORDER — KETOROLAC TROMETHAMINE 30 MG/ML IJ SOLN
INTRAMUSCULAR | Status: AC
  Filled 2020-11-26: qty 1

## 2020-11-26 MED ORDER — POVIDONE-IODINE 10 % EX SWAB
2.0000 "application " | Freq: Once | CUTANEOUS | Status: AC
  Administered 2020-11-26: 2 via TOPICAL

## 2020-11-26 MED ORDER — OXYTOCIN-SODIUM CHLORIDE 30-0.9 UT/500ML-% IV SOLN
INTRAVENOUS | Status: AC
  Filled 2020-11-26: qty 500

## 2020-11-26 MED ORDER — SCOPOLAMINE 1 MG/3DAYS TD PT72
1.0000 | MEDICATED_PATCH | TRANSDERMAL | Status: DC
  Administered 2020-11-26: 1.5 mg via TRANSDERMAL

## 2020-11-26 SURGICAL SUPPLY — 33 items
BENZOIN TINCTURE PRP APPL 2/3 (GAUZE/BANDAGES/DRESSINGS) ×2 IMPLANT
CHLORAPREP W/TINT 26ML (MISCELLANEOUS) ×2 IMPLANT
CLAMP CORD UMBIL (MISCELLANEOUS) IMPLANT
CLOTH BEACON ORANGE TIMEOUT ST (SAFETY) ×2 IMPLANT
DRSG OPSITE POSTOP 4X10 (GAUZE/BANDAGES/DRESSINGS) ×2 IMPLANT
ELECT REM PT RETURN 9FT ADLT (ELECTROSURGICAL) ×2
ELECTRODE REM PT RTRN 9FT ADLT (ELECTROSURGICAL) ×1 IMPLANT
EXTRACTOR VACUUM M CUP 4 TUBE (SUCTIONS) IMPLANT
GLOVE BIO SURGEON STRL SZ 6.5 (GLOVE) ×2 IMPLANT
GLOVE BIOGEL PI IND STRL 7.0 (GLOVE) ×2 IMPLANT
GLOVE BIOGEL PI INDICATOR 7.0 (GLOVE) ×2
GOWN STRL REUS W/TWL LRG LVL3 (GOWN DISPOSABLE) ×4 IMPLANT
KIT ABG SYR 3ML LUER SLIP (SYRINGE) IMPLANT
NEEDLE HYPO 22GX1.5 SAFETY (NEEDLE) IMPLANT
NEEDLE HYPO 25X5/8 SAFETYGLIDE (NEEDLE) IMPLANT
NS IRRIG 1000ML POUR BTL (IV SOLUTION) ×2 IMPLANT
PACK C SECTION WH (CUSTOM PROCEDURE TRAY) ×2 IMPLANT
PAD OB MATERNITY 4.3X12.25 (PERSONAL CARE ITEMS) ×2 IMPLANT
PENCIL SMOKE EVAC W/HOLSTER (ELECTROSURGICAL) ×2 IMPLANT
STRIP CLOSURE SKIN 1/2X4 (GAUZE/BANDAGES/DRESSINGS) ×2 IMPLANT
SUT MON AB 4-0 PS1 27 (SUTURE) ×2 IMPLANT
SUT PLAIN 0 NONE (SUTURE) ×2 IMPLANT
SUT PLAIN 2 0 XLH (SUTURE) IMPLANT
SUT VIC AB 0 CT1 36 (SUTURE) ×4 IMPLANT
SUT VIC AB 0 CTX 36 (SUTURE) ×2
SUT VIC AB 0 CTX36XBRD ANBCTRL (SUTURE) ×2 IMPLANT
SUT VIC AB 2-0 CT1 27 (SUTURE) ×1
SUT VIC AB 2-0 CT1 TAPERPNT 27 (SUTURE) ×1 IMPLANT
SUT VIC AB 4-0 KS 27 (SUTURE) ×2 IMPLANT
SYR CONTROL 10ML LL (SYRINGE) IMPLANT
TOWEL OR 17X24 6PK STRL BLUE (TOWEL DISPOSABLE) ×2 IMPLANT
TRAY FOLEY W/BAG SLVR 14FR LF (SET/KITS/TRAYS/PACK) IMPLANT
WATER STERILE IRR 1000ML POUR (IV SOLUTION) ×2 IMPLANT

## 2020-11-26 NOTE — Transfer of Care (Signed)
Immediate Anesthesia Transfer of Care Note  Patient: Brianna Taylor  Procedure(s) Performed: Repeat CESAREAN SECTION (N/A )  Patient Location: PACU  Anesthesia Type:Spinal  Level of Consciousness: awake  Airway & Oxygen Therapy: Patient Spontanous Breathing  Post-op Assessment: Report given to RN  Post vital signs: Reviewed and stable  Last Vitals:  Vitals Value Taken Time  BP    Temp    Pulse    Resp    SpO2      Last Pain:  Vitals:   11/26/20 0946  TempSrc: Oral         Complications: No complications documented.

## 2020-11-26 NOTE — Anesthesia Preprocedure Evaluation (Signed)
Anesthesia Evaluation  Patient identified by MRN, date of birth, ID band Patient awake    Reviewed: Allergy & Precautions, NPO status , Patient's Chart, lab work & pertinent test results  History of Anesthesia Complications Negative for: history of anesthetic complications  Airway Mallampati: II  TM Distance: >3 FB Neck ROM: Full    Dental  (+) Dental Advisory Given   Pulmonary neg pulmonary ROS,    breath sounds clear to auscultation       Cardiovascular hypertension (PIH), Pt. on medications  Rhythm:Regular Rate:Normal     Neuro/Psych negative neurological ROS     GI/Hepatic GERD  Medicated,HELLP:  Elevated LFTs   Endo/Other  diabetes, Gestational  Renal/GU negative Renal ROS     Musculoskeletal   Abdominal (+) + obese,   Peds  Hematology plt 104k,    Anesthesia Other Findings   Reproductive/Obstetrics (+) Pregnancy                             Anesthesia Physical  Anesthesia Plan  ASA: III  Anesthesia Plan: Spinal   Post-op Pain Management:    Induction:   PONV Risk Score and Plan: 2 and Treatment may vary due to age or medical condition, Scopolamine patch - Pre-op and Ondansetron  Airway Management Planned: Natural Airway  Additional Equipment:   Intra-op Plan:   Post-operative Plan:   Informed Consent: I have reviewed the patients History and Physical, chart, labs and discussed the procedure including the risks, benefits and alternatives for the proposed anesthesia with the patient or authorized representative who has indicated his/her understanding and acceptance.     Dental advisory given  Plan Discussed with: CRNA and Surgeon  Anesthesia Plan Comments:         Anesthesia Quick Evaluation

## 2020-11-26 NOTE — Brief Op Note (Signed)
11/26/2020  12:19 PM  PATIENT:  Almira Bar  40 y.o. female  PRE-OPERATIVE DIAGNOSIS:  Previous Cesarean Section, Type 2 Diabetes,CHRONIC HYPERTENSION  POST-OPERATIVE DIAGNOSIS:  Previous Cesarean Section, Type 2 Diabetes,CHRONIC HYPERTENSION  PROCEDURE:  Procedure(s) with comments: Repeat CESAREAN SECTION (N/A) - EDD: 12/07/20 Repeat low-transverse cesarean section with 2 layer closure  SURGEON:  Surgeon(s) and Role:    Noland Fordyce, MD - Primary  PHYSICIAN ASSISTANT:   ASSISTANTS: Carlean Jews, CNM  ANESTHESIA:   spinal  EBL: Per nursing notes  BLOOD ADMINISTERED:none  DRAINS: Urinary Catheter (Foley)   LOCAL MEDICATIONS USED:  NONE  SPECIMEN:  Source of Specimen:  Placenta to labor and delivery for disposal  DISPOSITION OF SPECIMEN:  As above  COUNTS:  YES  TOURNIQUET:  * No tourniquets in log *  DICTATION: .Note written in EPIC  PLAN OF CARE: Admit to inpatient   PATIENT DISPOSITION:  PACU - hemodynamically stable.   Delay start of Pharmacological VTE agent (>24hrs) due to surgical blood loss or risk of bleeding: yes

## 2020-11-26 NOTE — Op Note (Signed)
11/26/2020  12:19 PM  PATIENT:  Brianna Taylor  40 y.o. female  PRE-OPERATIVE DIAGNOSIS:  Previous Cesarean Section, Type 2 Diabetes,CHRONIC HYPERTENSION  POST-OPERATIVE DIAGNOSIS:  Previous Cesarean Section, Type 2 Diabetes,CHRONIC HYPERTENSION  PROCEDURE:  Procedure(s) with comments: Repeat CESAREAN SECTION (N/A) - EDD: 12/07/20 Repeat low-transverse cesarean section with 2 layer closure  SURGEON:  Surgeon(s) and Role:    Noland Fordyce, MD - Primary  PHYSICIAN ASSISTANT:   ASSISTANTS: Carlean Jews, CNM  ANESTHESIA:   spinal  EBL: Per nursing notes  BLOOD ADMINISTERED:none  DRAINS: Urinary Catheter (Foley)   LOCAL MEDICATIONS USED:  NONE  SPECIMEN:  Source of Specimen:  Placenta to labor and delivery for disposal  DISPOSITION OF SPECIMEN:  As above  COUNTS:  YES  TOURNIQUET:  * No tourniquets in log *  DICTATION: .Note written in EPIC  PLAN OF CARE: Admit to inpatient   PATIENT DISPOSITION:  PACU - hemodynamically stable.   Delay start of Pharmacological VTE agent (>24hrs) due to surgical blood loss or risk of bleeding: yes     Findings:  @BABYSEXEBC @ infant,  APGAR (1 MIN): Per nursing notes.  Good cry and tone APGAR (5 MINS):   APGAR (10 MINS):   Normal uterus, tubes and ovaries, normal placenta. 3VC, clear amniotic fluid  EBL: Per nursing notes cc Antibiotics:   2g Ancef Complications: none  Indications: This is a 39 y.o. year-old, G4, P1 at [redacted]w[redacted]d admitted for repeat cesarean section. Risks benefits and alternatives of the procedure were discussed with the patient who agreed to proceed  Procedure:  After informed consent was obtained the patient was taken to the operating room where spinal anesthesia was initiated.  She was prepped and draped in the normal sterile fashion in dorsal supine position with a leftward tilt.  A foley catheter was in place.  A Pfannenstiel skin incision was made 2 cm above the pubic symphysis in the midline with the scalpel  over the prior Pfannenstiel incision.  Dissection was carried down with the Bovie cautery until the fascia was reached. The fascia was incised in the midline. The incision was extended laterally with the Mayo scissors. The inferior aspect of the fascial incision was grasped with the Coker clamps, elevated up and the underlying rectus muscles were dissected off sharply. The superior aspect of the fascial incision was grasped with the Coker clamps elevated up and the underlying rectus muscles were dissected off sharply.  The peritoneum was entered bluntly. The peritoneal incision was extended superiorly and inferiorly with good visualization of the bladder. The bladder blade was inserted and palpation was done to assess the fetal position and the location of the uterine vessels.  A bladder flap was created sharply the lower segment of the uterus was incised sharply with the scalpel and extended  bluntly in the cephalo-caudal fashion. The infant was grasped, brought to the incision,  rotated and the infant was delivered with fundal pressure. The nose and mouth were bulb suctioned. The cord was clamped and cut after 1 minute delay. The infant was handed off to the waiting pediatrician. The placenta was expressed. The uterus was exteriorized. The uterus was cleared of all clots and debris. The uterine incision was repaired with 0 Vicryl in a running locked fashion.  A second layer of the same suture was used in an imbricating fashion to obtain excellent hemostasis.  The uterus was then returned to the abdomen, the gutters were cleared of all clots and debris. The uterine incision was reinspected  and found to be hemostatic. The peritoneum was grasped and closed with 2-0 Vicryl in a running fashion.  During this closure small omental adhesion was noted to the peritoneum.  A clamp was placed along this adhesion and Bovie cautery was used to transect the adhesion just at the level of the peritoneum and a free tie was placed  along the omental pedicle. the cut muscle edges and the underside of the fascia were inspected and found to be hemostatic. The fascia was closed with 0 Vicryl in 2 halves. The subcutaneous tissue was irrigated. Scarpa's layer was closed with a 2-0 plain gut suture. The skin was closed with a 4-0 Monocryl in a single layer. The patient tolerated the procedure well. Sponge lap and needle counts were correct x3 and patient was taken to the recovery room in a stable condition.  Lendon Colonel 11/26/2020 12:21 PM

## 2020-11-26 NOTE — Anesthesia Postprocedure Evaluation (Signed)
Anesthesia Post Note  Patient: Brianna Taylor  Procedure(s) Performed: Repeat CESAREAN SECTION (N/A )     Patient location during evaluation: PACU Anesthesia Type: Spinal Level of consciousness: awake and alert Pain management: pain level controlled Vital Signs Assessment: post-procedure vital signs reviewed and stable Respiratory status: spontaneous breathing, nonlabored ventilation and respiratory function stable Cardiovascular status: blood pressure returned to baseline and stable Postop Assessment: no apparent nausea or vomiting Anesthetic complications: no   No complications documented.  Last Vitals:  Vitals:   11/26/20 1330 11/26/20 1347  BP: 118/87 122/64  Pulse: (!) 59 (!) 55  Resp: 12 14  Temp:  36.7 C  SpO2: 96% 98%    Last Pain:  Vitals:   11/26/20 1347  TempSrc:   PainSc: 0-No pain   Pain Goal:                Epidural/Spinal Function Cutaneous sensation: Normal sensation (11/26/20 1347), Patient able to flex knees: Yes (11/26/20 1347), Patient able to lift hips off bed: Yes (11/26/20 1347), Back pain beyond tenderness at insertion site: No (11/26/20 1347), Progressively worsening motor and/or sensory loss: No (11/26/20 1347), Bowel and/or bladder incontinence post epidural: No (11/26/20 1347)  Lowella Curb

## 2020-11-26 NOTE — H&P (Signed)
Brianna Taylor is a 40 y.o. G2P0101 at [redacted]w[redacted]d presenting for repeat cesarean section. Pt notes rare contractions . Good fetal movement, No vaginal bleeding, not leaking fluid.  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  Desires repeat C-section -Chronic hypertension diagnosed at 13 weeks.  Started on baby aspirin and labetalol 100 mg twice daily increased to 3 times daily through the pregnancy with stable blood pressures -Pre-existing type 2 diabetes, hemoglobin A1c started 6.4, dropped to 5.7 and climbed to 6.5.  Fasting blood sugars remained around 105 with postprandials all less than 120.  Normal fetal echo.  Normal third trimester testing.  Patient increased Basaglar through the pregnancy and ended at 56 units nightly -Anemia, on iron -Polyhydramnios.  AGA GERD   Prenatal Transfer Tool  Maternal Diabetes: Yes:  Diabetes Type:  Pre-pregnancy Genetic Screening: Normal Maternal Ultrasounds/Referrals: Normal Fetal Ultrasounds or other Referrals:  Fetal echo Maternal Substance Abuse:  No Significant Maternal Medications:  None Significant Maternal Lab Results: Group B Strep negative     OB History    Gravida  2   Para  1   Term      Preterm  1   AB      Living  1     SAB      IAB      Ectopic      Multiple  0   Live Births  1          Past Medical History:  Diagnosis Date  . Diabetes mellitus without complication (HCC)    type 2 on insulin  . History of HELLP syndrome, currently pregnant   . History of pre-eclampsia   . Vaginal Pap smear, abnormal    Past Surgical History:  Procedure Laterality Date  . CESAREAN SECTION N/A 12/12/2017   Procedure: CESAREAN SECTION;  Surgeon: Noland Fordyce, MD;  Location: [facility] BIRTHING SUITES;  Service: Obstetrics;  Laterality: N/A;  .  COLPOSCOPY     Family History: family history is not on file. Social History:  reports that she has never smoked. She has never used smokeless tobacco. She reports current alcohol use. She reports previous drug use.  Review of Systems - Negative except Discomfort of pregnancy and anxiety over C-section     Blood pressure 131/80, pulse 76, temperature 98.3 F (36.8 C), temperature source Oral, resp. rate 16, height 5\' 9"  (1.753 m), weight 117 kg, SpO2 95 %, unknown if currently breastfeeding.  Physical Exam:  Gen: well appearing, no distress  Abd: gravid, NT, no RUQ pain LE: Trace edema, equal bilaterally, non-tender   Prenatal labs: ABO, Rh: --/--/A POS (04/19 07-25-1994) Antibody: NEG (04/19 07-25-1994) Rubella: Immune (08/26 0000) RPR: NON REACTIVE (04/19 0917)  HBsAg: Negative (10/06 0000)  HIV: Non-reactive (10/06 0000)  GBS:   Negative 1 hr Glucola not done, pre-existing type 2 diabetes  Genetic screening normal panorama, normal AFP Anatomy 07-19-1997 normal   Assessment/Plan: 40 y.o. G2P0101 at [redacted]w[redacted]d Prior C-section, plan repeat C-section Chronic hypertension on labetalol 100 mg three daily with no evidence of preeclampsia Type 2 diabetes on Basaglar 56 units will have that amount postpartum and follow blood sugars Reports penicillin allergy but has done well with Ancef in the past   [redacted]w[redacted]d 11/26/2020, 11:01 AM

## 2020-11-26 NOTE — Anesthesia Procedure Notes (Signed)
Spinal  Patient location during procedure: OB Start time: 11/26/2020 11:17 AM End time: 11/26/2020 11:22 AM Reason for block: surgical anesthesia Staffing Anesthesiologist: Lowella Curb, MD Preanesthetic Checklist Completed: patient identified, IV checked, risks and benefits discussed, surgical consent, monitors and equipment checked, pre-op evaluation and timeout performed Spinal Block Patient position: sitting Prep: DuraPrep and site prepped and draped Patient monitoring: heart rate, cardiac monitor, continuous pulse ox and blood pressure Approach: midline Location: L3-4 Injection technique: single-shot Needle Needle type: Pencan  Needle gauge: 24 G Needle length: 10 cm Assessment Sensory level: T4 Events: CSF return Additional Notes SAB placed by SRNA under direct supervision

## 2020-11-27 LAB — CBC
HCT: 30.7 % — ABNORMAL LOW (ref 36.0–46.0)
Hemoglobin: 10.3 g/dL — ABNORMAL LOW (ref 12.0–15.0)
MCH: 31.5 pg (ref 26.0–34.0)
MCHC: 33.6 g/dL (ref 30.0–36.0)
MCV: 93.9 fL (ref 80.0–100.0)
Platelets: 177 10*3/uL (ref 150–400)
RBC: 3.27 MIL/uL — ABNORMAL LOW (ref 3.87–5.11)
RDW: 14.1 % (ref 11.5–15.5)
WBC: 10.3 10*3/uL (ref 4.0–10.5)
nRBC: 0 % (ref 0.0–0.2)

## 2020-11-27 LAB — GLUCOSE, CAPILLARY
Glucose-Capillary: 159 mg/dL — ABNORMAL HIGH (ref 70–99)
Glucose-Capillary: 156 mg/dL — ABNORMAL HIGH (ref 70–99)
Glucose-Capillary: 229 mg/dL — ABNORMAL HIGH (ref 70–99)

## 2020-11-27 LAB — BIRTH TISSUE RECOVERY COLLECTION (PLACENTA DONATION)

## 2020-11-27 MED ORDER — INSULIN GLARGINE 100 UNIT/ML ~~LOC~~ SOLN
30.0000 [IU] | SUBCUTANEOUS | Status: DC
  Administered 2020-11-27: 30 [IU] via SUBCUTANEOUS
  Filled 2020-11-27 (×2): qty 0.3

## 2020-11-27 NOTE — Progress Notes (Signed)
Call Arlan Organ CNM to see if she wanted the patients blood pressure medication held tonight.   Fausto Skillern states to hold medication. Blood pressure was 127/60.

## 2020-11-27 NOTE — Progress Notes (Addendum)
Subjective: POD# 1 Live born female  Birth Weight: 8 lb 0.2 oz (3635 g) APGAR: 9, 9  Newborn Delivery   Birth date/time: 11/26/2020 11:51:00 Delivery type: C-Section, Low Transverse Trial of labor: No C-section categorization: Repeat     Baby name: Tanner Delivering provider: Noland Fordyce   circumcision yes planning Feeding: bottle  Pain control at delivery: Spinal   Reports feeling well overall.   Patient reports tolerating PO.   Breast symptoms:none  Pain controlled with 650mg  PO Tylenol and 600mg  PO Ibuprofen PRN Denies HA/SOB/C/P/N/V/dizziness. Flatus present. No BM yet. She reports vaginal bleeding as normal, without clots.  She is ambulating, urinating without difficulty.     Objective:   VS:    Vitals:   11/26/20 1920 11/26/20 2141 11/27/20 0201 11/27/20 0536  BP:  (!) 106/45 (!) 96/43 (!) 104/57  Pulse: 80 62 66 61  Resp: 18 17 18 16   Temp:  98.6 F (37 C) 98.4 F (36.9 C) 98.3 F (36.8 C)  TempSrc:  Oral Oral Oral  SpO2: 98% 97% 97%   Weight:      Height:          Intake/Output Summary (Last 24 hours) at 11/27/2020 11/29/20 Last data filed at 11/27/2020 0305 Gross per 24 hour  Intake 2320 ml  Output 2603 ml  Net -283 ml        Recent Labs    11/24/20 0917 11/27/20 0523  WBC 9.3 10.3  HGB 12.8 10.3*  HCT 38.7 30.7*  PLT 247 177     Blood type: --/--/A POS (04/19 11/26/20)  Rubella: Immune (08/26 0000)  Vaccines: TDaP          UTD         Flu             UTD                    COVID-19 UTD   Physical Exam:  General: alert, cooperative and no distress CV: Regular rate and rhythm or S1S2 present Resp: clear, unlabored breathing  Abdomen: soft, nontender, active bowel sounds in all 4 quadrants.  Incision: dry, intact and small amout of serosanguinous drainage noted on HC dressing.  Uterine Fundus: firm, below umbilicus, tender to palpation Lochia: minimal Ext: mild non-pitting edema noted; no erythema or sx of DVT.   Assessment/Plan: 40  y.o.   POD# 1. 0355                   Principal Problem:   Postpartum care following cesarean delivery (4/21)  -Continue with routine Post/op care. Continue PO 650mg  of tylenol and 600mg  PO ibuprofen for pain management PRN. Encouraged frequent ambulation and warm fluids for gut motility. Continue to advance diet as tolerated. Infection precautions reviewed. Encourage rest when baby rests Active Problems:   Previous cesarean section  -Stable Status.   Type 2 diabetes mellitus affecting pregnancy, antepartum  -Continue BCG 2PP  - Lantus decrease to 20 units at HS   Chronic hypertension affecting pregnancy  -Stable on 100mg  of PO labetalol.   - hold labetalol for BP < 120/70               Shantonette 30) [id number], BSN, RNC-OB  Student Nurse-Midwife   11/27/2020  12:38 PM   Medical screening examination/treatment/procedure(s) were conducted as a shared visit with non-physician practitioner(s) and myself.  I personally evaluated the patient during the encounter.   , CNM, MSN 11/27/2020, 12:58  PM

## 2020-11-28 ENCOUNTER — Encounter (HOSPITAL_COMMUNITY): Payer: Self-pay | Admitting: Obstetrics

## 2020-11-28 LAB — GLUCOSE, CAPILLARY
Glucose-Capillary: 73 mg/dL (ref 70–99)
Glucose-Capillary: 86 mg/dL (ref 70–99)

## 2020-11-28 MED ORDER — BASAGLAR KWIKPEN 100 UNIT/ML ~~LOC~~ SOPN: 26.0000 [IU] | PEN_INJECTOR | Freq: Every day | SUBCUTANEOUS | 0 refills | Status: AC

## 2020-11-28 MED ORDER — IBUPROFEN 600 MG PO TABS: 600.0000 mg | ORAL_TABLET | Freq: Four times a day (QID) | ORAL | 0 refills | Status: AC

## 2020-11-28 MED ORDER — OXYCODONE HCL 5 MG PO TABS: 5.0000 mg | ORAL_TABLET | Freq: Four times a day (QID) | ORAL | 0 refills | Status: AC | PRN

## 2020-11-28 MED ORDER — ACETAMINOPHEN 500 MG PO TABS: 1000.0000 mg | ORAL_TABLET | Freq: Four times a day (QID) | ORAL | 0 refills | Status: AC

## 2020-11-28 NOTE — Progress Notes (Signed)
Inpatient Diabetes Program Recommendations  AACE/ADA: New Consensus Statement on Inpatient Glycemic Control (2015)  Target Ranges:  Prepandial:   less than 140 mg/dL      Peak postprandial:   less than 180 mg/dL (1-2 hours)      Critically ill patients:  140 - 180 mg/dL   Lab Results  Component Value Date   GLUCAP 86 11/28/2020    Review of Glycemic Control  Diabetes history: DM 2 before pregnancy Outpatient Diabetes medications: Metformin 500 mg Daily prior to pregnancy Current orders for Inpatient glycemic control:  Lantus 30 units  Inpatient Diabetes Program Recommendations:    Spoke with pt over the phone regarding glucose control at home after hospital. Checking glucose before the meals. Pt has experienced hypoglycemia in the past and knows what to look for. Told pt to call PCP for adjustments if needed. Discussed glucose and A1c goals. Pt will follow up with [facility] in [place].  Thanks,  Christena Deem RN, MSN, BC-ADM Inpatient Diabetes Coordinator Team Pager [phone] (8a-5p)

## 2020-11-28 NOTE — Progress Notes (Signed)
Subjective: POD# 2 RC/s  Live born female  Birth Weight: 8 lb 0.2 oz (3635 g) APGAR: 9, 9  Newborn Delivery   Birth date/time: 11/26/2020 11:51:00 Delivery type: C-Section, Low Transverse Trial of labor: No C-section categorization: Repeat     Baby name: Tanner Delivering provider: Noland Fordyce   circumcision done Feeding: bottle  Pain control at delivery: Spinal   Reports feeling well overall.   Patient reports tolerating PO.   Breast symptoms:none  Pain controlled with 650mg  PO Tylenol and 600mg  PO Ibuprofen PRN Denies HA/SOB/C/P/N/V/dizziness. Flatus present. No BM yet. She reports vaginal bleeding as normal, without clots.  She is ambulating, urinating without difficulty.     Objective:   VS:    Vitals:   11/27/20 1618 11/27/20 2200 11/28/20 0548 11/28/20 0855  BP: 131/60 127/60 128/68 (!) 144/76  Pulse:  96 60 63  Resp:  18 18 16   Temp:  98.3 F (36.8 C) (!) 97.5 F (36.4 C) 98.7 F (37.1 C)  TempSrc:  Oral Oral Oral  SpO2:  100% 98% 99%  Weight:      Height:         No intake or output data in the 24 hours ending 11/28/20 1152      CBC Latest Ref Rng & Units 11/27/2020 11/24/2020 09/12/2018  WBC 4.0 - 10.5 K/uL 10.3 9.3 9.7  Hemoglobin 12.0 - 15.0 g/dL 10.3(L) 12.8 14.1  Hematocrit 36.0 - 46.0 % 30.7(L) 38.7 42.5  Platelets 150 - 400 K/uL 177 247 276   CBG (last 3)  Recent Labs    11/27/20 1624 11/27/20 2200 11/28/20 0917  GLUCAP 156* 229* 73      Blood type: --/--/A POS (04/19 11/29/20)  Rubella: Immune (08/26 0000)  Vaccines: TDaP          UTD         Flu             UTD                    COVID-19 UTD   Physical Exam:  General: alert, cooperative and no distress CV: Regular rate and rhythm or S1S2 present Resp: clear, unlabored breathing  Abdomen: soft, nontender, active bowel sounds in all 4 quadrants.  Incision: dry, intact and small amout of serosanguinous drainage noted on HC dressing.  Uterine Fundus: firm, below umbilicus, tender to  palpation Lochia: minimal Ext: mild non-pitting edema noted; no erythema or sx of DVT.   Assessment/Plan: 40 y.o.   POD# 1. 2426                     Previous cesarean section- post-op care and warning s/s d/w pt    Type 2 diabetes- was only on Metformin 500mg  daily pre-preg, continue that and/ Lantus at 1/2 dose at 26 u   Chronic hypertension affecting pregnancy  -Stable on 100mg  of PO labetalol TID, can hold labetalol for BP < 110/70  F/up in 4-5 days in office for BP and weight check   06-11-2002 MD 11/28/2020, 11:52 AM

## 2020-11-28 NOTE — Discharge Summary (Signed)
Postpartum Discharge Summary    Patient Name: Brianna Taylor DOB: 11-27-1980 MRN: [id number]  Date of admission: 11/26/2020 Delivery date:11/26/2020  Delivering provider: Noland Fordyce  Date of discharge: 11/28/2020  Admitting diagnosis: 38.3 wks pregnancy.                                      History of one C-section.                                      Pre-pregnancy type II DM                                     Chronic hypertension diagnosed at 14 wks in this pregnancy. H/o HELLP in past pregnancy, desiring elective repeat C-section. Delivery before 39 wks for chronic hypertension.      Discharge diagnosis: Term Pregnancy Delivered, CHTN and Type 2 DM                                              Post partum procedures:none Augmentation: N/A Complications: None  Hospital course: Sceduled C/S   40 y.o. yo G2P1102 at [redacted]w[redacted]d was admitted to the hospital 11/26/2020 for scheduled cesarean section with the following indication:Elective Repeat.Delivery details are as follows:  Membrane Rupture Time/Date: 11:50 AM ,11/26/2020   Delivery Method:C-Section, Low Transverse  Details of operation can be found in separate operative note.  Patient had an uncomplicated postpartum course.  She is ambulating, tolerating a regular diet, passing flatus, and urinating well. Patient is discharged home in stable condition on  11/29/20  Postpartum we restarted Metformin and Lantus down to 26 units        Newborn Data: Birth date:11/26/2020  Birth time:11:51 AM  Gender:Female  Living status:Living  Apgars:9 ,9  Weight:3635 g      Physical exam  Vitals:   11/27/20 1618 11/27/20 2200 11/28/20 0548 11/28/20 0855  BP: 131/60 127/60 128/68 (!) 144/76  Pulse:  96 60 63  Resp:  18 18 16   Temp:  98.3 F (36.8 C) (!) 97.5 F (36.4 C) 98.7 F (37.1 C)  TempSrc:  Oral Oral Oral  SpO2:  100% 98% 99%  Weight:      Height:       General: alert and cooperative Lochia: appropriate Uterine Fundus:  firm Incision: Healing well with no significant drainage DVT Evaluation: No evidence of DVT seen on physical exam. Labs: Lab Results  Component Value Date   WBC 10.3 11/27/2020   HGB 10.3 (L) 11/27/2020   HCT 30.7 (L) 11/27/2020   MCV 93.9 11/27/2020   PLT 177 11/27/2020   CMP Latest Ref Rng & Units 09/12/2018  Glucose 70 - 99 mg/dL 11/11/2018)  BUN 6 - 20 mg/dL 10  Creatinine 735(D - 8.97 mg/dL 8.47  Sodium 8.41 - 282 mmol/L 142  Potassium 3.5 - 5.1 mmol/L 3.4(L)  Chloride 98 - 111 mmol/L 111  CO2 22 - 32 mmol/L 21(L)  Calcium 8.9 - 10.3 mg/dL 081)  Total Protein 6.5 - 8.1 g/dL -  Total Bilirubin 0.3 - 1.2 mg/dL -  Alkaline Phos 38 -  126 U/L -  AST 15 - 41 U/L -  ALT 14 - 54 U/L -   Edinburgh Score: Edinburgh Postnatal Depression Scale Screening Tool 11/26/2020  I have been able to laugh and see the funny side of things. 0  I have looked forward with enjoyment to things. 0  I have blamed myself unnecessarily when things went wrong. 1  I have been anxious or worried for no good reason. 0  I have felt scared or panicky for no good reason. 0  Things have been getting on top of me. 0  I have been so unhappy that I have had difficulty sleeping. 0  I have felt sad or miserable. 0  I have been so unhappy that I have been crying. 0  The thought of harming myself has occurred to me. 0  Edinburgh Postnatal Depression Scale Total 1      After visit meds:  Allergies as of 11/28/2020      Reactions   Penicillins Hives   Has patient had a PCN reaction causing immediate rash, facial/tongue/throat swelling, SOB or lightheadedness with hypotension: unknown Has patient had a PCN reaction causing severe rash involving mucus membranes or skin necrosis: unknown Has patient had a PCN reaction that required hospitalization: unknown Has patient had a PCN reaction occurring within the last 10 years: no If all of the above answers are "NO", then may proceed with Cephalosporin use.   Sulfa  Antibiotics Rash   Other reaction(s): Headache      Medication List    TAKE these medications   acetaminophen 500 MG tablet Commonly known as: TYLENOL Take 2 tablets (1,000 mg total) by mouth every 6 (six) hours.   aspirin EC 81 MG tablet Take 81 mg by mouth daily. Swallow whole.   Basaglar KwikPen 100 UNIT/ML Inject 26 Units into the skin at bedtime. What changed: how much to take   ferrous sulfate 325 (65 FE) MG EC tablet Take 325 mg by mouth every other day.   ibuprofen 600 MG tablet Commonly known as: ADVIL Take 1 tablet (600 mg total) by mouth every 6 (six) hours.   labetalol 100 MG tablet Commonly known as: NORMODYNE Take 100 mg by mouth 3 (three) times daily.   metFORMIN 750 MG 24 hr tablet Commonly known as: GLUCOPHAGE-XR Take 1 tablet (750 mg total) by mouth daily with breakfast.   oxyCODONE 5 MG immediate release tablet Commonly known as: Oxy IR/ROXICODONE Take 1-2 tablets (5-10 mg total) by mouth every 6 (six) hours as needed for up to 7 days for moderate pain.   prenatal multivitamin Tabs tablet Take 1 tablet by mouth daily.            Discharge Care Instructions  (From admission, onward)         Start     Ordered   11/28/20 0000  Discharge wound care:       Comments: You can shower with honeycomb dressing. Dr Ernestina Penna needs to see you in 3-5 days for BP check and can remove dressing in office   11/28/20 1200          Discharge home in stable condition Infant Feeding: Bottle Infant Disposition:home with mother Discharge instruction: per After Visit Summary and Postpartum booklet. Activity: Advance as tolerated. Pelvic rest for 6 weeks.  Diet: carb modified diet and low salt diet Anticipated Birth Control: Unsure Postpartum Appointment:2-3 days  For BP check and weight in office. Can remove dressing in office   Future  Appointments:No future appointments. but schedule at 2-3 days next wk and at 6 wks forPP check Follow up  Visit:  11/28/2020 Robley Fries, MD
